# Patient Record
Sex: Female | Born: 1970 | Race: White | Hispanic: No | Marital: Married | Smoking: Never smoker
Health system: Southern US, Community
[De-identification: ages and names within clinical notes are randomized; demographics above are authoritative.]

## PROBLEM LIST (undated history)

## (undated) DIAGNOSIS — I10 Essential (primary) hypertension: Secondary | ICD-10-CM

## (undated) DIAGNOSIS — M0760 Enteropathic arthropathies, unspecified site: Secondary | ICD-10-CM

## (undated) DIAGNOSIS — R600 Localized edema: Secondary | ICD-10-CM

## (undated) DIAGNOSIS — B0229 Other postherpetic nervous system involvement: Secondary | ICD-10-CM

## (undated) DIAGNOSIS — G4700 Insomnia, unspecified: Secondary | ICD-10-CM

## (undated) DIAGNOSIS — Z8719 Personal history of other diseases of the digestive system: Secondary | ICD-10-CM

## (undated) DIAGNOSIS — N83209 Unspecified ovarian cyst, unspecified side: Secondary | ICD-10-CM

## (undated) DIAGNOSIS — M069 Rheumatoid arthritis, unspecified: Secondary | ICD-10-CM

## (undated) DIAGNOSIS — M1990 Unspecified osteoarthritis, unspecified site: Secondary | ICD-10-CM

## (undated) DIAGNOSIS — R609 Edema, unspecified: Secondary | ICD-10-CM

## (undated) HISTORY — DX: Personal history of other diseases of the digestive system: Z87.19

## (undated) HISTORY — DX: Localized edema: R60.0

## (undated) HISTORY — DX: Rheumatoid arthritis, unspecified: M06.9

## (undated) HISTORY — PX: LUMBAR FUSION: SHX111

## (undated) HISTORY — DX: Unspecified osteoarthritis, unspecified site: M19.90

## (undated) HISTORY — PX: CHOLECYSTECTOMY: SHX55

## (undated) HISTORY — DX: Insomnia, unspecified: G47.00

## (undated) HISTORY — DX: Unspecified ovarian cyst, unspecified side: N83.209

## (undated) HISTORY — DX: Enteropathic arthropathies, unspecified site: M07.60

## (undated) HISTORY — DX: Other postherpetic nervous system involvement: B02.29

## (undated) HISTORY — PX: OTHER SURGICAL HISTORY: SHX169

## (undated) HISTORY — DX: Essential (primary) hypertension: I10

## (undated) HISTORY — DX: Edema, unspecified: R60.9

---

## 2015-12-07 DIAGNOSIS — N838 Other noninflammatory disorders of ovary, fallopian tube and broad ligament: Secondary | ICD-10-CM | POA: Insufficient documentation

## 2015-12-17 DIAGNOSIS — R102 Pelvic and perineal pain: Secondary | ICD-10-CM | POA: Insufficient documentation

## 2015-12-17 DIAGNOSIS — M545 Low back pain, unspecified: Secondary | ICD-10-CM | POA: Insufficient documentation

## 2016-01-01 DIAGNOSIS — Z9889 Other specified postprocedural states: Secondary | ICD-10-CM | POA: Insufficient documentation

## 2016-01-01 DIAGNOSIS — Z09 Encounter for follow-up examination after completed treatment for conditions other than malignant neoplasm: Secondary | ICD-10-CM | POA: Insufficient documentation

## 2016-10-05 DIAGNOSIS — K5190 Ulcerative colitis, unspecified, without complications: Secondary | ICD-10-CM | POA: Insufficient documentation

## 2016-10-05 DIAGNOSIS — M0760 Enteropathic arthropathies, unspecified site: Secondary | ICD-10-CM | POA: Insufficient documentation

## 2016-10-05 HISTORY — DX: Ulcerative colitis, unspecified, without complications: K51.90

## 2018-05-17 DIAGNOSIS — R110 Nausea: Secondary | ICD-10-CM | POA: Insufficient documentation

## 2018-05-17 DIAGNOSIS — R1031 Right lower quadrant pain: Secondary | ICD-10-CM | POA: Insufficient documentation

## 2018-07-26 ENCOUNTER — Encounter: Payer: Self-pay | Admitting: Neurology

## 2018-08-31 ENCOUNTER — Encounter: Payer: Self-pay | Admitting: Neurology

## 2018-09-01 NOTE — Progress Notes (Signed)
[facility] Clinic Note - Initial Visit   Date: 09/05/2018   [name] MRN: [id number] DOB: 1970-06-23   Dear Dr. Penelope Galas:  Thank you for your kind referral of Michelle Newman for consultation of generalized pain and weakness. Although her history is well known to you, please allow [place] to reiterate it for the purpose of our medical record. The patient was accompanied to the clinic by self.  History of Present Illness: Michelle Newman is a 48 y.o. right-handed female with ulcerative colitis, rheumatoid arthritis (followed by Dr. Amil Amen), recurrent shingles, hypertension, and chronic pain presenting for evaluation of generalized weakness and myalgias.  Starting in 2019, she began having generalized pain and muscle spasms involving the arms, legs, and back.  She complains of frequent falls, having 5-6 falls in 2020 and usually always involves going up or down stairs. She has imbalance which is worse in the evening.  She is chronically tired and feels exhausted, especially after lunchtime.  She takes a nap a few times per week, which allows her to work for a few more hours, but then quickly gets exhausted again.  Some days, she has difficulty opening her eye lids, reporting that they are completely shut when she wakes up.  This can improve as the day goes on.  She also has problems with constipation and urinary retention.  She is frustrated that no one can help her.  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    Past Medical History:  Diagnosis Date  . Edema   . Enteropathic arthritis   . Hx of chronic ulcerative colitis   . Hypertension   . Insomnia   . Other postherpetic nervous system involvement   . Ovarian cyst   . Peripheral edema     Past Surgical History:  Procedure Laterality Date  . CHOLECYSTECTOMY    . ex lap for ruptured ovarian cyst    . hysterectomy with ovaries remaining       Medications:  Outpatient Encounter Medications  as of 09/05/2018  Medication Sig  . albuterol (VENTOLIN HFA) 108 (90 Base) MCG/ACT inhaler Inhale into the lungs every 6 (six) hours as needed for wheezing or shortness of breath.  Marland Kitchen atenolol-chlorthalidone (TENORETIC) 50-25 MG tablet Take 0.5 tablets by mouth daily. Takes 1/2 tab 50/78m  . folic acid (FOLVITE) 1 MG tablet Take 1 mg by mouth 2 (two) times daily.  .Marland Kitchengabapentin (NEURONTIN) 300 MG capsule Take 300 mg by mouth. Takes one tablet qhs  . sulfaSALAzine (AZULFIDINE) 500 MG tablet Take 500 mg by mouth 2 (two) times daily.  .Marland KitchensulfaSALAzine (AZULFIDINE) 500 MG tablet TAKE 2 TABLET TWICE DAILY  . torsemide (DEMADEX) 10 MG tablet Take 10 mg by mouth daily. 1-2 tablests prn for swelling  . atenolol (TENORMIN) 25 MG tablet Take by mouth.  . Golimumab (SIMPONI) 100 MG/ML SOSY Inject into the skin.  .Marland KitchenHYDROcodone-acetaminophen (NORCO) 7.5-325 MG tablet Take 1 tablet by mouth every 6 (six) hours as needed. for pain  . Multiple Vitamin (MULTI-VITAMIN) tablet   . traMADol (ULTRAM) 50 MG tablet Take by mouth.  . valACYclovir (VALTREX) 500 MG tablet Take by mouth.   No facility-administered encounter medications on file as of 09/05/2018.     Allergies:  Allergies  Allergen Reactions  . Flexeril [Cyclobenzaprine]     Unable to urinate   . Metoprolol Succinate [Metoprolol Tartrate]     Flushing     Family History: Family History  Problem Relation Age  of Onset  . Non-Hodgkin's lymphoma Mother        htn  . Hypertension Mother   . CAD Father     Social History: Social History   Tobacco Use  . Smoking status: Never Smoker  . Smokeless tobacco: Never Used  Substance Use Topics  . Alcohol use: Never    Frequency: Never  . Drug use: Never   Social History   Social History Narrative   Married 2 sons and one girl   One story   Right handed     Review of Systems:  CONSTITUTIONAL: No fevers, chills, night sweats, or weight loss.   EYES: No visual changes or eye pain ENT: No  hearing changes.  No history of nose bleeds.   RESPIRATORY: No cough, wheezing and shortness of breath.   CARDIOVASCULAR: Negative for chest pain, and palpitations.   GI: Negative for abdominal discomfort, blood in stools or black stools.  No recent change in bowel habits.   GU:  +history of incontinence.   MUSCLOSKELETAL: +history of joint pain or swelling.  +myalgias.   SKIN: Negative for lesions, rash, and itching.   HEMATOLOGY/ONCOLOGY: Negative for prolonged bleeding, bruising easily, and swollen nodes.  No history of cancer.   ENDOCRINE: Negative for cold or heat intolerance, polydipsia or goiter.   PSYCH:  +depression +anxiety symptoms.   NEURO: As Above.   Vital Signs:  BP (!) 134/91   Pulse 72   Temp 98.9 F (37.2 C)   Ht '5\' 2"'  (1.575 m)   Wt 156 lb 12.8 oz (71.1 kg)   SpO2 97%   BMI 28.68 kg/m    General Medical Exam:   General:  Well appearing, comfortable.   Eyes/ENT: see cranial nerve examination.   Neck:   No carotid bruits. Respiratory:  Clear to auscultation, good air entry bilaterally.   Cardiac:  Regular rate and rhythm, no murmur.   Extremities:  No deformities, edema, or skin discoloration.  Skin:  No rashes or lesions.  Neurological Exam: MENTAL STATUS including orientation to time, place, person, recent and remote memory, attention span and concentration, language, and fund of knowledge is normal.  Speech is not dysarthric.  CRANIAL NERVES: II:  No visual field defects.  .   III-IV-VI: Pupils equal round and reactive to light.  Normal conjugate, extra-ocular eye movements in all directions of gaze.  No nystagmus.  No ptosis.   V:  Normal facial sensation.    VII:  Normal facial symmetry and movements.   VIII:  Normal hearing and vestibular function.   IX-X:  Normal palatal movement.   XI:  Normal shoulder shrug and head rotation.   XII:  Normal tongue strength and range of motion, no deviation or fasciculation.  MOTOR:  No atrophy, fasciculations or  abnormal movements.  No pronator drift.   Upper Extremity:  Right  Left  Deltoid  5/5   5/5   Biceps  5/5   5/5   Triceps  5/5   5/5   Infraspinatus 5/5  5/5  Medial pectoralis 5/5  5/5  Wrist extensors  5/5   5/5   Wrist flexors  5/5   5/5   Finger extensors  5/5   5/5   Finger flexors  5/5   5/5   Dorsal interossei  5/5   5/5   Abductor pollicis  5/5   5/5   Tone (Ashworth scale)  0  0   Lower Extremity:  Right  Left  Hip  flexors  5/5   5/5   Hip extensors  5/5   5/5   Adductor 5/5  5/5  Abductor 5/5  5/5  Knee flexors  5/5   5/5   Knee extensors  5/5   5/5   Dorsiflexors  5/5   5/5   Plantarflexors  5/5   5/5   Toe extensors  5/5   5/5   Toe flexors  5/5   5/5   Tone (Ashworth scale)  0  0   MSRs:  Right        Left                  brachioradialis 2+  2+  biceps 2+  2+  triceps 2+  2+  patellar 3+  3+  ankle jerk 2+  2+  Hoffman no  no  plantar response down  down   SENSORY:  Normal and symmetric perception of light touch, pinprick, vibration, and proprioception.  Romberg's sign absent.   COORDINATION/GAIT: Normal finger-to- nose-finger and heel-to-shin.  Intact rapid alternating movements bilaterally.  Able to rise from a chair without using arms.  Gait narrow based and stable. Tandem and stressed gait intact.    IMPRESSION: Mrs. Corbit is a 48 year-old female presenting with a myriad of nonspecific symptoms including generalized pain, weakness and fatigue.  Exam shows brisk reflexes throughout, otherwise normal motor strength, intact muscle bulk, and normal sensation.  Her symptomology is not consistent with primary neuromuscular disorder as symptoms are too widespread and exam is remarkably intact.  She had many questions on potential diagnosis and I reassured her that she does not have CIDP as I would expect reflexes to be absent or reduced and there would be more objective findings on exam such as motor or sensory changes.  Myasthenia gravis also is unlikely with  her widespread pain, since this is painless neuromuscular junction disorder.  To be complete, I will screen for myasthenia, myopathy, and B12 deficiency.  She will also undergo MRI brain and cervical spine to check for demyelinating disease, as she does have other autoimmune disease.  If this is normal, NCS/EMG will be the next step.   Greater than 50% of this 45 minute visit was spent in counseling, explanation of diagnosis, planning of further management, and coordination of care.   Thank you for allowing me to participate in patient's care.  If I can answer any additional questions, I would be pleased to do so.    Sincerely,    Oliviagrace Crisanti K. Posey Pronto, DO

## 2018-09-05 ENCOUNTER — Other Ambulatory Visit: Payer: Self-pay

## 2018-09-05 ENCOUNTER — Encounter: Payer: Self-pay | Admitting: Neurology

## 2018-09-05 ENCOUNTER — Other Ambulatory Visit (INDEPENDENT_AMBULATORY_CARE_PROVIDER_SITE_OTHER): Payer: 59

## 2018-09-05 ENCOUNTER — Ambulatory Visit (INDEPENDENT_AMBULATORY_CARE_PROVIDER_SITE_OTHER): Payer: 59 | Admitting: Neurology

## 2018-09-05 VITALS — BP 134/91 | HR 72 | Temp 98.9°F | Ht 62.0 in | Wt 156.8 lb

## 2018-09-05 DIAGNOSIS — G8929 Other chronic pain: Secondary | ICD-10-CM | POA: Diagnosis not present

## 2018-09-05 DIAGNOSIS — R292 Abnormal reflex: Secondary | ICD-10-CM | POA: Diagnosis not present

## 2018-09-05 DIAGNOSIS — R52 Pain, unspecified: Secondary | ICD-10-CM | POA: Diagnosis not present

## 2018-09-05 DIAGNOSIS — R531 Weakness: Secondary | ICD-10-CM | POA: Diagnosis not present

## 2018-09-05 LAB — VITAMIN B12: Vitamin B-12: 377 pg/mL (ref 211–911)

## 2018-09-05 LAB — CK: Total CK: 46 U/L (ref 7–177)

## 2018-09-05 NOTE — Patient Instructions (Addendum)
Check labs  MRI brain and cervical spine  We will call you with the results and let you know the next step.

## 2018-09-09 ENCOUNTER — Telehealth: Payer: Self-pay | Admitting: Neurology

## 2018-09-09 NOTE — Telephone Encounter (Signed)
Called spoke with patient she is aware and understands

## 2018-09-09 NOTE — Telephone Encounter (Signed)
Results back  Please review then advise

## 2018-09-09 NOTE — Telephone Encounter (Signed)
CK and vitamin B12 is normal.  Myasthenia panel take up to 3 weeks to come back.  We will notify of these results, when available.

## 2018-09-09 NOTE — Telephone Encounter (Signed)
Patient is wanting her recent results from blood work. Thanks!

## 2018-09-15 LAB — MYASTHENIA GRAVIS PANEL 2
A CHR BINDING ABS: <0.3 nmol/L
ACHR Blocking Abs: <15 % Inhibition (ref <15)
Acetylchol Modul Ab: 14 % Inhibition

## 2018-09-19 ENCOUNTER — Telehealth: Payer: Self-pay

## 2018-09-19 NOTE — Telephone Encounter (Signed)
Patient notified of labs via voicemail

## 2018-09-19 NOTE — Telephone Encounter (Signed)
-----   Message from Alda Berthold, DO sent at 09/16/2018  4:51 PM EDT ----- Please notify patient lab are within normal limits, including antibodies for myasthenia.  Thank you.

## 2018-09-21 ENCOUNTER — Telehealth: Payer: Self-pay | Admitting: Neurology

## 2018-09-21 NOTE — Telephone Encounter (Signed)
Patient is calling in about lab work results. Please call her back. Thanks!

## 2018-09-21 NOTE — Telephone Encounter (Signed)
No answer at 150 

## 2018-09-23 ENCOUNTER — Telehealth: Payer: Self-pay

## 2018-09-23 NOTE — Telephone Encounter (Signed)
Patient is returning your call.    Thanks

## 2018-09-23 NOTE — Telephone Encounter (Signed)
Patient called in regards to her labs, notified her of results

## 2018-09-27 ENCOUNTER — Other Ambulatory Visit: Payer: Self-pay | Admitting: Internal Medicine

## 2018-09-27 DIAGNOSIS — N6320 Unspecified lump in the left breast, unspecified quadrant: Secondary | ICD-10-CM

## 2018-10-05 ENCOUNTER — Ambulatory Visit
Admission: RE | Admit: 2018-10-05 | Discharge: 2018-10-05 | Disposition: A | Discharge status: Home / Self Care | Payer: 59 | Source: Ambulatory Visit | Attending: Neurology | Admitting: Neurology

## 2018-10-05 DIAGNOSIS — R292 Abnormal reflex: Secondary | ICD-10-CM

## 2018-10-05 DIAGNOSIS — R531 Weakness: Secondary | ICD-10-CM

## 2018-10-05 MED ORDER — GADOBENATE DIMEGLUMINE 529 MG/ML IV SOLN
14.0000 mL | Freq: Once | INTRAVENOUS | Status: AC | PRN
  Administered 2018-10-05: 14:00:00 14 mL via INTRAVENOUS

## 2018-10-06 ENCOUNTER — Other Ambulatory Visit: Payer: Self-pay

## 2018-10-06 ENCOUNTER — Ambulatory Visit
Admission: RE | Admit: 2018-10-06 | Discharge: 2018-10-06 | Disposition: A | Discharge status: Home / Self Care | Payer: 59 | Source: Ambulatory Visit | Attending: Internal Medicine | Admitting: Internal Medicine

## 2018-10-06 DIAGNOSIS — N6320 Unspecified lump in the left breast, unspecified quadrant: Secondary | ICD-10-CM

## 2018-10-07 ENCOUNTER — Other Ambulatory Visit: Payer: Self-pay

## 2018-10-07 ENCOUNTER — Telehealth: Payer: Self-pay

## 2018-10-07 ENCOUNTER — Telehealth: Payer: Self-pay | Admitting: Neurology

## 2018-10-07 DIAGNOSIS — R52 Pain, unspecified: Secondary | ICD-10-CM

## 2018-10-07 DIAGNOSIS — G8929 Other chronic pain: Secondary | ICD-10-CM

## 2018-10-07 DIAGNOSIS — R531 Weakness: Secondary | ICD-10-CM

## 2018-10-07 NOTE — Telephone Encounter (Signed)
-----   Message from Alda Berthold, DO sent at 10/07/2018 12:58 PM EDT ----- Please inform patient that her MRI brain is normal  - no sign of stroke, multiple sclerosis, or tumor.  Her MRI cervical spine shows disc bulges at a few levels, which is slightly worse when compared to 2016.  It would not explain her widespread pain and weakness, so I recommend NCS/EMG of the arm and leg (worse side) as the next step.

## 2018-10-07 NOTE — Telephone Encounter (Signed)
Patient called to see if her MRI results are ready for her yet from [facility] 10/05/2018.

## 2018-10-07 NOTE — Telephone Encounter (Signed)
Patient advised of results and dana to schedule emg

## 2018-10-07 NOTE — Telephone Encounter (Signed)
Patient informed, see result note

## 2018-11-24 ENCOUNTER — Other Ambulatory Visit: Payer: Self-pay

## 2018-11-24 ENCOUNTER — Ambulatory Visit (INDEPENDENT_AMBULATORY_CARE_PROVIDER_SITE_OTHER): Payer: 59 | Admitting: Neurology

## 2018-11-24 DIAGNOSIS — R52 Pain, unspecified: Secondary | ICD-10-CM

## 2018-11-24 DIAGNOSIS — R531 Weakness: Secondary | ICD-10-CM

## 2018-11-24 DIAGNOSIS — G8929 Other chronic pain: Secondary | ICD-10-CM

## 2018-11-24 NOTE — Procedures (Signed)
[facility]  [address] [place], [address]  [place], [place] [address] Tel: [phone] Fax:  [phone] Test Date:  11/24/2018  Patient: Michelle Newman DOB: Dec 09, 1970 Physician: Nita Sickle, DO  Sex: Female Height: 5\' 2"  Ref Phys: , DO  ID#: Nita Sickle Temp: 32.0C Technician:    Patient Complaints: This is a 48 year old female referred for evaluation of generalized malaise and weakness.  NCV & EMG Findings: Extensive electrodiagnostic testing of the right upper and lower extremities along with repetitive nerve stimulation shows: 1. All sensory responses are within normal limits including the right median, ulnar, mixed palmar, sural, and superficial peroneal nerves. 2. All motor responses are within normal limits including right median, ulnar, peroneal, and tibial nerves. 3. Repetitive nerve stimulation of the median and peroneal nerves recording at the abductor pollicis brevis and tibialis anterior, respectively, is within normal limits.  Specifically, there is no abnormal decrement. 4. Right tibial H reflex study is within normal limits. 5. There is no evidence of active or chronic motor axonal loss changes affecting any of the tested muscles.  Motor unit configuration and recruitment pattern is within normal limits.  Impression: This is a normal study.  In particular, there is no evidence of a generalized myopathy, neuromuscular junction disorder, sensorimotor neuropathy, or cervical/lumbosacral radiculopathy.   ___________________________ 51, DO    Nerve Conduction Studies Anti Sensory Summary Table   Site NR Peak (ms) Norm Peak (ms) P-T Amp (V) Norm P-T Amp  Right Median Anti Sensory (2nd Digit)  32C  Wrist    2.5 <3.4 48.8 >20  Right Sup Peroneal Anti Sensory (Ant Lat Mall)  32C  12 cm    1.7 <4.5 26.8 >5  Right Sural Anti Sensory (Lat Mall)  32C  Calf    2.4 <4.5 25.9 >5  Right Ulnar Anti Sensory (5th Digit)  32C  Wrist     2.3 <3.1 53.7 >12   Motor Summary Table   Site NR Onset (ms) Norm Onset (ms) O-P Amp (mV) Norm O-P Amp Site1 Site2 Delta-0 (ms) Dist (cm) Vel (m/s) Norm Vel (m/s)  Right Median Motor (Abd Poll Brev)  32C  Wrist    2.3 <3.9 10.3 >6 Elbow Wrist 4.2 26.0 62 >50  Elbow    6.5  10.1         Right Peroneal Motor (Ext Dig Brev)  32C  Ankle    2.7 <5.5 4.3 >3 B Fib Ankle 6.1 35.0 57 >40  B Fib    8.8  4.0  Poplt B Fib 1.4 8.0 57 >40  Poplt    10.2  4.0         Right Tibial Motor (Abd Hall Brev)  32C  Ankle    2.5 <6.0 14.4 >8 Knee Ankle 7.1 36.0 51 >40  Knee    9.6  11.3         Right Ulnar Motor (Abd Dig Minimi)  32C  Wrist    1.8 <3.1 10.5 >7 B Elbow Wrist 3.2 21.0 66 >50  B Elbow    5.0  10.3  A Elbow B Elbow 1.6 10.0 63 >50  A Elbow    6.6  10.1          Comparison Summary Table   Site NR Peak (ms) Norm Peak (ms) P-T Amp (V) Site1 Site2 Delta-P (ms) Norm Delta (ms)  Right Median/Ulnar Palm Comparison (Wrist - 8cm)  32C  Median Palm    1.6 <2.2 78.7 Median  Palm Ulnar Palm 0.1   Ulnar Palm    1.5 <2.2 20.0       H Reflex Studies   NR H-Lat (ms) Lat Norm (ms) L-R H-Lat (ms)  Right Tibial (Gastroc)  32C     29.12 <35    EMG   Side Muscle Ins Act Fibs Psw Fasc Number Recrt Dur Dur. Amp Amp. Poly Poly. Comment  Right 1stDorInt Nml Nml Nml Nml Nml Nml Nml Nml Nml Nml Nml Nml N/A  Right AntTibialis Nml Nml Nml Nml Nml Nml Nml Nml Nml Nml Nml Nml N/A  Right Gastroc Nml Nml Nml Nml Nml Nml Nml Nml Nml Nml Nml Nml N/A  Right Flex Dig Long Nml Nml Nml Nml Nml Nml Nml Nml Nml Nml Nml Nml N/A  Right RectFemoris Nml Nml Nml Nml Nml Nml Nml Nml Nml Nml Nml Nml N/A  Right GluteusMed Nml Nml Nml Nml Nml Nml Nml Nml Nml Nml Nml Nml N/A  Right PronatorTeres Nml Nml Nml Nml Nml Nml Nml Nml Nml Nml Nml Nml N/A  Right Biceps Nml Nml Nml Nml Nml Nml Nml Nml Nml Nml Nml Nml N/A  Right Triceps Nml Nml Nml Nml Nml Nml Nml Nml Nml Nml Nml Nml N/A  Right Deltoid Nml Nml Nml Nml Nml Nml Nml Nml  Nml Nml Nml Nml N/A   RNS   Trial # Label Amp 1 (mV)  O-P Amp 5 (mV)  O-P Amp % Dif Area 1 (mVms) Area 5 (mVms) Area % Dif Rep Rate Train Length Pause Time (min:sec) Comments  Right AntTibialis  Tr 1 Baseline 5.03 5.04 0.2 30.54 29.64 -2.9 3.00 10 00:30   Tr 2 Post Exercise 5.02 4.80 -4.3 30.37 28.19 -7.2 3.00 10 01:00   Tr 3 1 Min Post 4.94 4.69 -4.9 30.55 28.17 -7.8 3.00 10 01:00   Tr 4 2 Min Post 4.89 4.78 -2.2 30.22 28.73 -4.9 3.00 10 01:00   Tr 5 3 Min Post 4.93 4.82 -2.2 30.61 29.25 -4.5 3.00 10 00:00   Right Abd Poll Brev  Tr 1 Baseline 11.52 11.54 0.2 32.24 30.73 -4.7 3.00 10 00:30   Tr 2 Post Exercise 11.81 11.73 -0.6 30.00 29.30 -2.3 3.00 10 01:00   Tr 3 1 Min Post 11.82 11.76 -0.5 31.69 30.06 -5.1 3.00 10 01:00   Tr 4 2 Min Post 11.79 11.73 -0.5 31.62 30.10 -4.8 3.00 10 01:00   Tr 5 3 Min Post 11.78 11.72 -0.5 31.80 30.11 -5.3 3.00 10 00:00         Waveforms:

## 2018-11-25 ENCOUNTER — Telehealth: Payer: Self-pay

## 2018-11-25 NOTE — Telephone Encounter (Signed)
Patient advised of emg results and notifed.

## 2019-01-23 DIAGNOSIS — M4722 Other spondylosis with radiculopathy, cervical region: Secondary | ICD-10-CM | POA: Insufficient documentation

## 2019-03-13 ENCOUNTER — Ambulatory Visit: Payer: Self-pay | Admitting: Podiatry

## 2019-03-14 ENCOUNTER — Other Ambulatory Visit: Payer: Self-pay | Admitting: Sports Medicine

## 2019-03-14 DIAGNOSIS — M79671 Pain in right foot: Secondary | ICD-10-CM

## 2019-03-15 ENCOUNTER — Telehealth: Payer: Self-pay | Admitting: *Deleted

## 2019-03-15 ENCOUNTER — Ambulatory Visit (INDEPENDENT_AMBULATORY_CARE_PROVIDER_SITE_OTHER): Payer: 59

## 2019-03-15 ENCOUNTER — Other Ambulatory Visit: Payer: Self-pay

## 2019-03-15 ENCOUNTER — Encounter: Payer: Self-pay | Admitting: Sports Medicine

## 2019-03-15 ENCOUNTER — Ambulatory Visit (INDEPENDENT_AMBULATORY_CARE_PROVIDER_SITE_OTHER): Payer: 59 | Admitting: Sports Medicine

## 2019-03-15 DIAGNOSIS — M792 Neuralgia and neuritis, unspecified: Secondary | ICD-10-CM | POA: Diagnosis not present

## 2019-03-15 DIAGNOSIS — M79671 Pain in right foot: Secondary | ICD-10-CM | POA: Diagnosis not present

## 2019-03-15 DIAGNOSIS — M84374A Stress fracture, right foot, initial encounter for fracture: Secondary | ICD-10-CM | POA: Diagnosis not present

## 2019-03-15 DIAGNOSIS — M779 Enthesopathy, unspecified: Secondary | ICD-10-CM | POA: Diagnosis not present

## 2019-03-15 MED ORDER — METHYLPREDNISOLONE 4 MG PO TBPK: ORAL_TABLET | ORAL | 0 refills | Status: DC

## 2019-03-15 NOTE — Telephone Encounter (Signed)
MRI order was put in today and is going to be at [facility]. Michelle Newman

## 2019-03-15 NOTE — Telephone Encounter (Signed)
Dr Marylene Land, the MRI order is in and it is at [facility]. Misty Stanley

## 2019-03-15 NOTE — Addendum Note (Signed)
Addended by: Hadley Pen R on: 03/15/2019 04:10 PM   Modules accepted: Orders

## 2019-03-15 NOTE — Telephone Encounter (Signed)
-----   Message from [place], [place] sent at 03/15/2019 12:41 PM EST ----- Regarding: MRI R foot MRI w/o contrast Chronic foot pain  R/o non healing fracuture/stress fracture 3rd metatarsal Right foot  Xrays nondiagnostic

## 2019-03-15 NOTE — Progress Notes (Signed)
Subjective: Michelle Newman is a 49 y.o. female patient who presents to office for evaluation of right foot pain. Patient complains of progressive pain especially over the last 8 months in the right foot that is worse underneath the ball at the third and fourth toe joints reports that over the last few months the pain has been worse causing her to walk differently and she has noticed that she has a callus now to the plantar aspect of her fifth metatarsal head reports that by the end of the day the third fourth and fifth toes hurt so much that it causes her to walk on the lateral side of the foot reports that she has a history of rheumatoid arthritis and the pain was not as bad when she was on her rheumatological medications however she had to stop taking those medications in order to have neck surgery and reports significant dull achy that worsens with some tingling to the toes pain is sharp 8 out of 10 by the end of the day and reports that her foot hurts so bad and she still feels the pressure like she is sleeping with a shoe on at the end of her day.  Denies injury/trip/fall/sprain/any causative factors.   Patient had x-rays 2 weeks ago performed by PCP.  Review of Systems  All other systems reviewed and are negative.   Patient Active Problem List   Diagnosis Date Noted  . Cervical spondylosis with radiculopathy 01/23/2019  . Right lower quadrant pain 05/17/2018  . Nausea 05/17/2018  . 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 Ulcerative colitis (HCC) 10/05/2016  . Postoperative examination 01/01/2016  . S/P exploratory laparotomy 01/01/2016  . Acute left-sided low back pain without sciatica 12/17/2015  . Pelvic pressure in female 12/17/2015  . Ovarian mass, left 12/07/2015    Current Outpatient Medications on File Prior to Visit  Medication Sig Dispense Refill  . albuterol (VENTOLIN HFA) 108 (90 Base) MCG/ACT inhaler Inhale into the lungs every 6 (six) hours as needed for wheezing or  shortness of breath.    Marland Kitchen atenolol-chlorthalidone (TENORETIC) 50-25 MG tablet Take 0.5 tablets by mouth daily. Takes 1/2 tab 50/25mg     . folic acid (FOLVITE) 1 MG tablet Take 1 mg by mouth 2 (two) times daily.    Marland Kitchen gabapentin (NEURONTIN) 300 MG capsule Take 300 mg by mouth. Takes one tablet qhs    . Golimumab (SIMPONI) 100 MG/ML SOSY Inject into the skin.    Marland Kitchen HYDROcodone-acetaminophen (NORCO) 7.5-325 MG tablet Take 1 tablet by mouth every 6 (six) hours as needed. for pain    . sulfaSALAzine (AZULFIDINE) 500 MG tablet TAKE 2 TABLET TWICE DAILY    . torsemide (DEMADEX) 10 MG tablet Take 10 mg by mouth daily. 1-2 tablests prn for swelling    . traMADol (ULTRAM) 50 MG tablet Take by mouth.     No current facility-administered medications on file prior to visit.    Allergies  Allergen Reactions  . Amoxicillin-Pot Clavulanate Nausea And Vomiting    GI bleeding noted within 24 hours of taking  . Flexeril [Cyclobenzaprine]     Unable to urinate   . Metoprolol Succinate [Metoprolol Tartrate]     Flushing     Objective:  General: Alert and oriented x3 in no acute distress  Dermatology: No open lesions bilateral lower extremities, no webspace macerations, no ecchymosis bilateral, all nails x 10 are well manicured.  Mild reactive callus first toe and plantar fifth metatarsophalangeal joint on right.  Vascular: Dorsalis  Pedis and Posterior Tibial pedal pulses palpable, Capillary Fill Time 3 seconds,(+) pedal hair growth bilateral, no edema bilateral lower extremities, Temperature gradient within normal limits.  Neurology: Gross sensation intact via light touch bilateral, subjective tingling to toes on right foot.  Musculoskeletal: Moderate tenderness to palpation along the third metatarsal shaft greater than the fourth and fifth on the right foot, there is pain with compression of the lesser metatarsals however no Mulder's click or no signs of acute neuroma. Strength within normal  limits.  Gait: Antalgic gait  Xrays  Right foot   Impression: There is questionable changes noted to the third metatarsal shaft suspicious of possible stress fracture, no other acute findings.  Assessment and Plan: Problem List Items Addressed This Visit    None    Visit Diagnoses    Stress fracture, right foot, initial encounter for fracture    -  Primary   Neuritis       Capsulitis       Right foot pain           -Complete examination performed -Xrays reviewed -Discussed treatment options for left foot pain possible stress fracture -Rx MRI for further evaluation and the extent of chronic pain to rule out fracture or neuroma/neuritis or severe capsulitis -Advised patient if her foot still hurts her may start steroid Dosepak to help with her symptoms as well -Advised patient to use cam boot of which she plans to borrow from a friend to help offload the forefoot until her MRI can be completed -Recommend rest ice elevation and to avoid strenuous activities that could exacerbate the pain in the ball of her foot -Patient to return to office after MRI or sooner if condition worsens.  Landis Martins, DPM

## 2019-03-15 NOTE — Telephone Encounter (Signed)
Ok thanks Make sure the patient has the information to call [facility] to schedule an appt for her MRI

## 2019-03-17 NOTE — Telephone Encounter (Signed)
I called the patient to let her know to come to [facility] [address] [place] and the patient was fine with coming to [place] and I gave her the phone number as well. Misty Stanley

## 2019-03-23 ENCOUNTER — Telehealth: Payer: Self-pay

## 2019-03-23 NOTE — Telephone Encounter (Signed)
MR foot w/o contrast approved Authorization number: [id number] J Exp: Feb 25-April 11-21

## 2019-03-23 NOTE — Telephone Encounter (Signed)
Peer to peer is require for MRI of Right foot.  Reference number: [id number]

## 2019-03-27 ENCOUNTER — Other Ambulatory Visit: Payer: Self-pay | Admitting: Sports Medicine

## 2019-03-27 DIAGNOSIS — M84374A Stress fracture, right foot, initial encounter for fracture: Secondary | ICD-10-CM

## 2019-03-29 ENCOUNTER — Other Ambulatory Visit: Payer: Self-pay

## 2019-03-29 ENCOUNTER — Ambulatory Visit
Admission: RE | Admit: 2019-03-29 | Discharge: 2019-03-29 | Disposition: A | Discharge status: Home / Self Care | Payer: 59 | Source: Ambulatory Visit | Attending: Sports Medicine | Admitting: Sports Medicine

## 2019-03-29 DIAGNOSIS — M79671 Pain in right foot: Secondary | ICD-10-CM

## 2019-03-29 DIAGNOSIS — M84374A Stress fracture, right foot, initial encounter for fracture: Secondary | ICD-10-CM

## 2019-04-07 ENCOUNTER — Other Ambulatory Visit: Payer: Self-pay

## 2019-04-07 ENCOUNTER — Ambulatory Visit (INDEPENDENT_AMBULATORY_CARE_PROVIDER_SITE_OTHER): Payer: 59 | Admitting: Sports Medicine

## 2019-04-07 ENCOUNTER — Ambulatory Visit: Payer: 59 | Admitting: Sports Medicine

## 2019-04-07 ENCOUNTER — Encounter: Payer: Self-pay | Admitting: Sports Medicine

## 2019-04-07 DIAGNOSIS — M7752 Other enthesopathy of left foot: Secondary | ICD-10-CM

## 2019-04-07 DIAGNOSIS — M069 Rheumatoid arthritis, unspecified: Secondary | ICD-10-CM

## 2019-04-07 DIAGNOSIS — M7751 Other enthesopathy of right foot: Secondary | ICD-10-CM

## 2019-04-07 DIAGNOSIS — M779 Enthesopathy, unspecified: Secondary | ICD-10-CM | POA: Diagnosis not present

## 2019-04-07 DIAGNOSIS — M79672 Pain in left foot: Secondary | ICD-10-CM

## 2019-04-07 DIAGNOSIS — M79671 Pain in right foot: Secondary | ICD-10-CM

## 2019-04-07 MED ORDER — TRIAMCINOLONE ACETONIDE 10 MG/ML IJ SUSP
10.0000 mg | Freq: Once | INTRAMUSCULAR | Status: AC
  Administered 2019-04-07: 10 mg

## 2019-04-07 NOTE — Progress Notes (Signed)
Subjective: Michelle Newman is a 49 y.o. female patient who presents to office for evaluation of right greater than left foot pain. Patient is also here for discussion of MRI results.  Patient reports that the pain is a lot better on the right when she is walking with the boot but when she takes it off it still hurts states that she is starting to feel a similar pain on her left foot and is wondering if this is from her rheumatoid arthritis reports that she has had 2 doses of her antirheumatologic medication and since taking this medication she has seen a difference with her joints as well.  Patient has completed steroid Dosepak and has been resting icing and elevating as much as possible.  Patient denies any new injury or problem and reports that the pain is to the balls of the foot and in between the toe joints right greater than left.  Patient Active Problem List   Diagnosis Date Noted  . Cervical spondylosis with radiculopathy 01/23/2019  . Right lower quadrant pain 05/17/2018  . Nausea 05/17/2018  . 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 Ulcerative colitis ([place]) 10/05/2016  . Postoperative examination 01/01/2016  . S/P exploratory laparotomy 01/01/2016  . Acute left-sided low back pain without sciatica 12/17/2015  . Pelvic pressure in female 12/17/2015  . Ovarian mass, left 12/07/2015    Current Outpatient Medications on File Prior to Visit  Medication Sig Dispense Refill  . albuterol (VENTOLIN HFA) 108 (90 Base) MCG/ACT inhaler Inhale into the lungs every 6 (six) hours as needed for wheezing or shortness of breath.    Marland Kitchen atenolol-chlorthalidone (TENORETIC) 50-25 MG tablet Take 0.5 tablets by mouth daily. Takes 1/2 tab 50/25mg     . folic acid (FOLVITE) 1 MG tablet Take 1 mg by mouth 2 (two) times daily.    02/$[id number]$$[id number]$ gabapentin (NEURONTIN) 300 MG capsule Take 300 mg by mouth. Takes one tablet qhs    . Golimumab (SIMPONI) 100 MG/ML SOSY Inject into the skin.    Marland Kitchen HYDROcodone-acetaminophen  (NORCO) 7.5-325 MG tablet Take 1 tablet by mouth every 6 (six) hours as needed. for pain    . methocarbamol (ROBAXIN) 500 MG tablet Take 500 mg by mouth 3 (three) times daily.    . methylPREDNISolone (MEDROL DOSEPAK) 4 MG TBPK tablet Take as directed 21 tablet 0  . sulfaSALAzine (AZULFIDINE) 500 MG tablet TAKE 2 TABLET TWICE DAILY    . torsemide (DEMADEX) 10 MG tablet Take 10 mg by mouth daily. 1-2 tablests prn for swelling    . traMADol (ULTRAM) 50 MG tablet Take by mouth.     No current facility-administered medications on file prior to visit.    Allergies  Allergen Reactions  . Amoxicillin-Pot Clavulanate Nausea And Vomiting    GI bleeding noted within 24 hours of taking  . Flexeril [Cyclobenzaprine]     Unable to urinate   . Metoprolol Succinate [Metoprolol Tartrate]     Flushing     Objective:  General: Alert and oriented x3 in no acute distress  Dermatology: No open lesions bilateral lower extremities, no webspace macerations, no ecchymosis bilateral, all nails x 10 are well manicured.  Vascular: Dorsalis Pedis and Posterior Tibial pedal pulses palpable, Capillary Fill Time 3 seconds,(+) pedal hair growth bilateral, no edema bilateral lower extremities, Temperature gradient within normal limits.  Neurology: 01-12-1989 sensation intact via light touch bilateral.  Musculoskeletal: Mild tenderness to palpation at the ball of the right greater than left foot especially in between the third  and fourth metatarsal heads as well as the second and third metatarsal heads with the third and fourth metatarsal heads being most symptomatic on the right.  MRI right foot, supportive of bursitis right third and fourth metatarsal heads and possible bursitis versus early ganglion cyst at the lateral aspect of the base of the second toe at the level of the metatarsophalangeal joint.  Gait: Cam boot assisted gait  Assessment and Plan: Problem List Items Addressed This Visit    None    Visit  Diagnoses    Capsulitis    -  Primary   Relevant Medications   triamcinolone acetonide (KENALOG) 10 MG/ML injection 10 mg (Start on 04/07/2019  7:45 PM)   Foot pain, bilateral       Bursitis of both feet       Rheumatoid arthritis of foot, unspecified laterality, unspecified whether rheumatoid factor present (HCC)       Relevant Medications   triamcinolone acetonide (KENALOG) 10 MG/ML injection 10 mg (Start on 04/07/2019  7:45 PM)     -Complete examination performed -MRI results reviewed -Discussed treatment options for capsulitis/bursitis secondary to rheumatoid arthritis and likely a flare after being taking off her rheumatoid medication for her neck surgery -After oral consent and aseptic prep, injected a mixture containing 1 ml of 2%  plain lidocaine, 1 ml 0.5% plain marcaine, 0.5 ml of kenalog 10 and 0.5 ml of dexamethasone phosphate into second and third web spaces in between the metatarsal heads on the right and left foot without complication. Post-injection care discussed with patient.  -Dispensed metatarsal pads to use in shoes as instructed and may transition to the shoes after the weekend if she is doing good and may discontinue the boot -Recommend good supportive shoes for foot type -Patient to return to office as needed or if symptoms are not better after 2 weeks or sooner if condition worsens.  Asencion Islam, DPM

## 2019-09-06 DIAGNOSIS — M4726 Other spondylosis with radiculopathy, lumbar region: Secondary | ICD-10-CM | POA: Insufficient documentation

## 2020-01-25 DIAGNOSIS — R0602 Shortness of breath: Secondary | ICD-10-CM | POA: Diagnosis not present

## 2020-03-15 IMAGING — MR MR FOOT*R* W/O CM
5 series · 40 of 40 positions shown · non-contrast
Comparison: Radiographs from 03/27/2019

CLINICAL DATA: Chronic right foot pain along the bases of the third
and fourth toes.

EXAM:
MRI OF THE RIGHT FOREFOOT WITHOUT CONTRAST
TECHNIQUE: Multiplanar, multisequence MR imaging of the right forefoot was
performed. No intravenous contrast was administered.

[Series 4: T1 · coronal · right · 3.0mm · 0.31mm/px · 10 of 45 slices shown (1 of 2)]
[im 1/45]
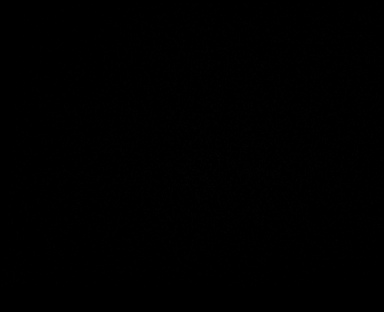
[im 5/45]
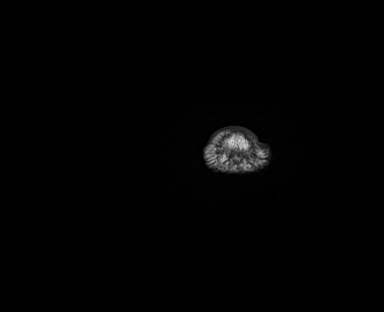
[im 10/45]
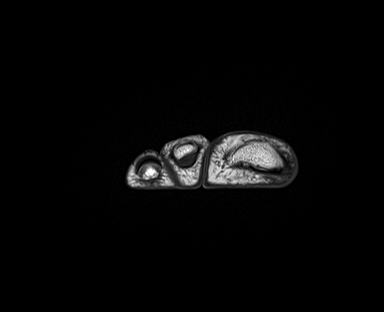
[im 15/45]
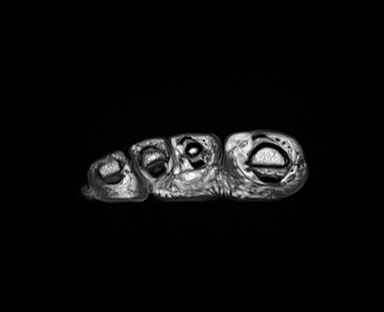
[im 20/45]
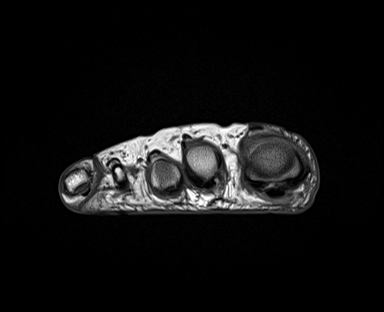
[im 25/45]
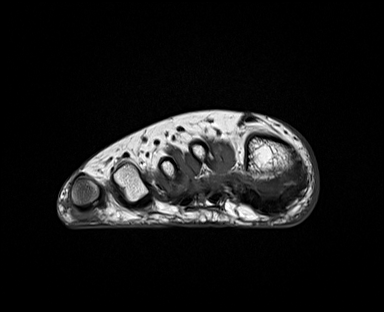
[im 30/45]
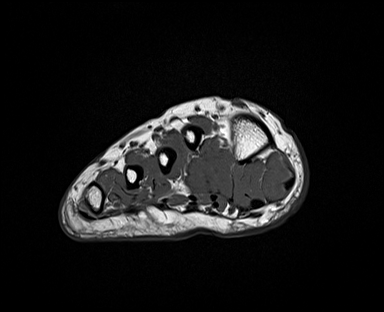
[im 35/45]
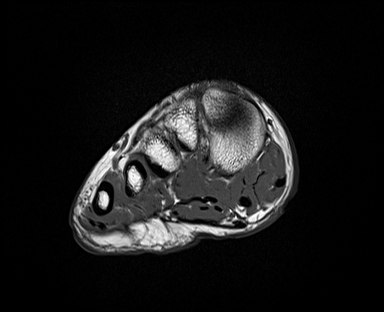
[im 40/45]
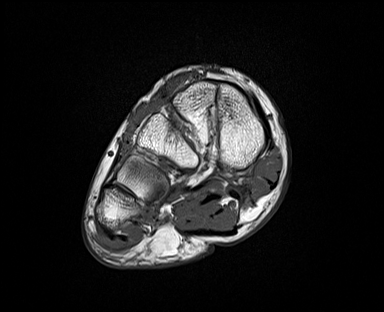
[im 45/45]
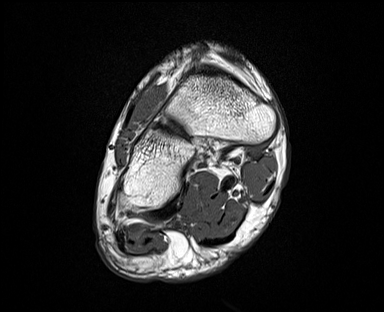

[Series 5: T2 fat-sat · coronal · right · 3.0mm · 0.31mm/px · 11 of 45 slices shown (1 of 2)]
[im 1/45]
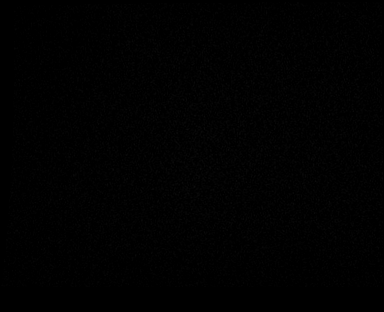
[im 5/45]
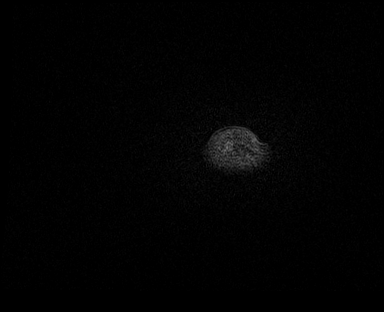
[im 9/45]
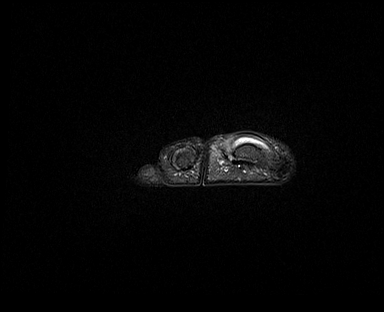
[im 14/45]
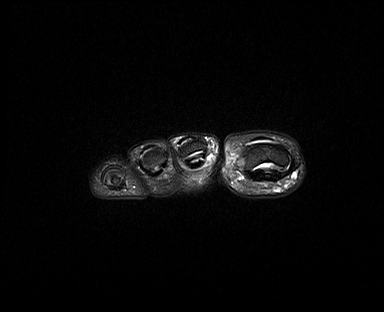
[im 18/45]
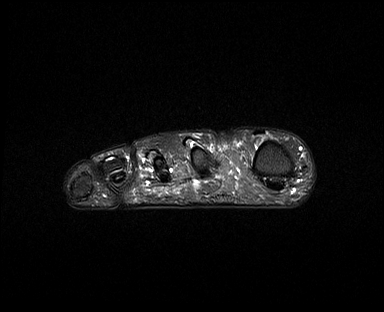
[im 23/45]
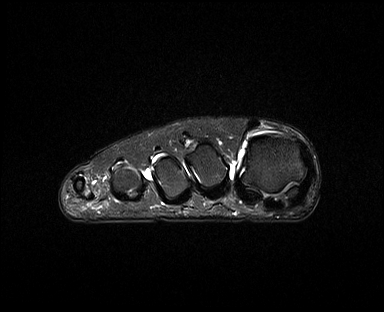
[im 27/45]
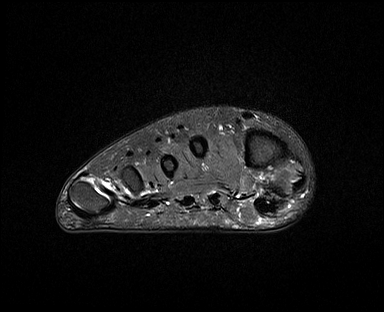
[im 31/45]
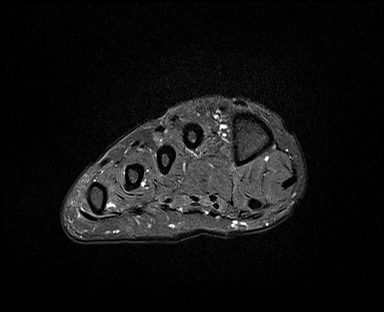
[im 36/45]
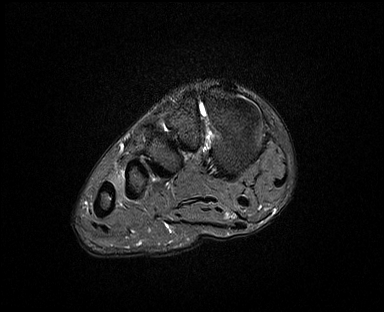
[im 40/45]
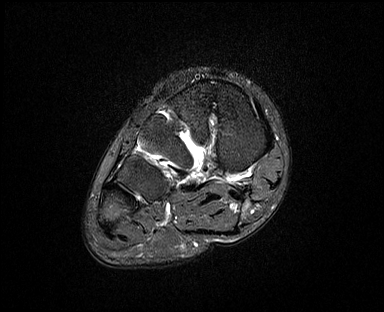
[im 45/45]
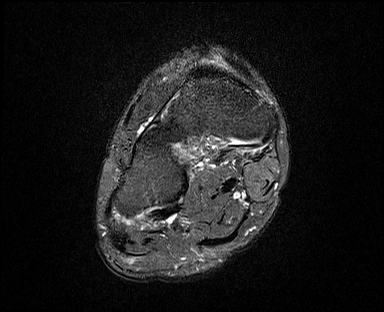

[Series 6: STIR · sagittal · right · 3.0mm · 0.70mm/px · 7 of 28 slices shown]
[im 1/28]
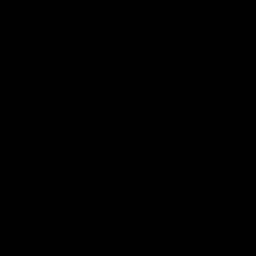
[im 5/28]
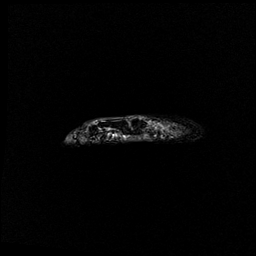
[im 10/28]
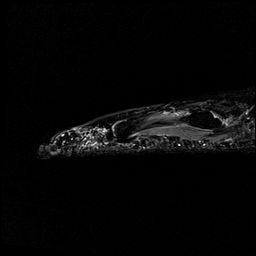
[im 14/28]
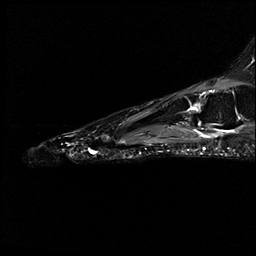
[im 19/28]
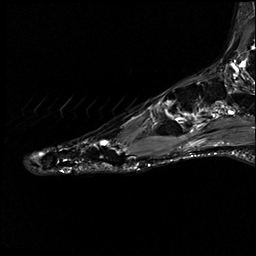
[im 23/28]
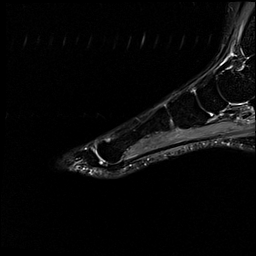
[im 28/28]
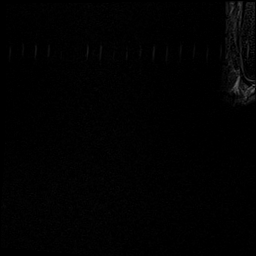

[Series 7: T1 · axial · right · 3.0mm · 0.47mm/px · z∈[-158,-79]mm · 6 of 23 slices shown (2 of 2)]
[im 1/23]
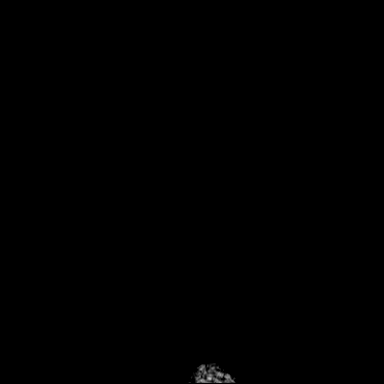
[im 5/23]
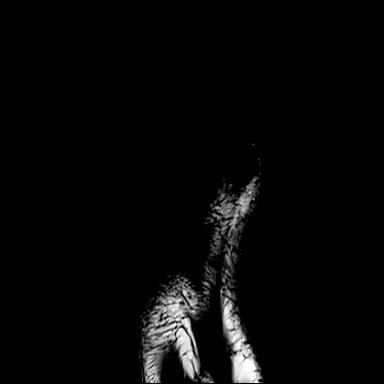
[im 9/23]
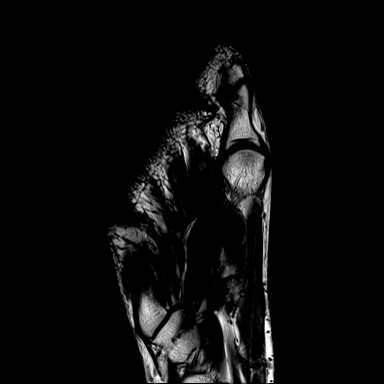
[im 14/23]
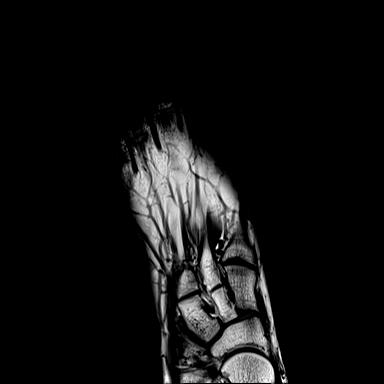
[im 18/23]
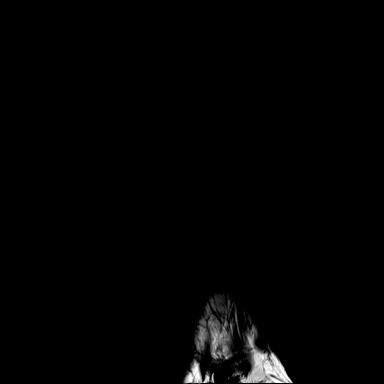
[im 23/23]
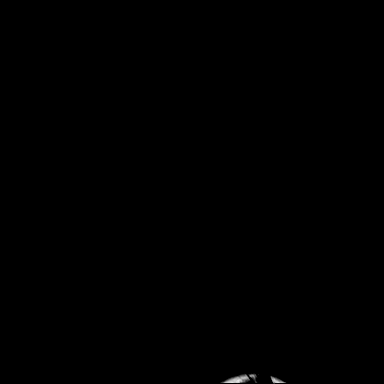

[Series 8: T2 fat-sat · axial · right · 3.0mm · 0.47mm/px · z∈[-158,-79]mm · 6 of 23 slices shown (2 of 2)]
[im 1/23]
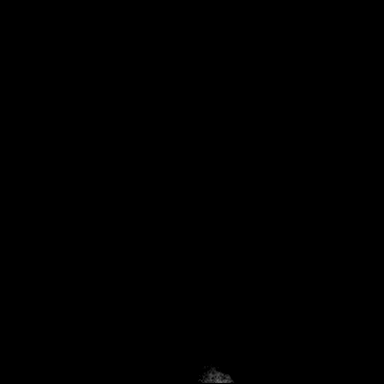
[im 5/23]
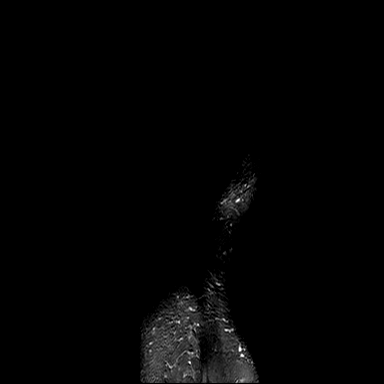
[im 9/23]
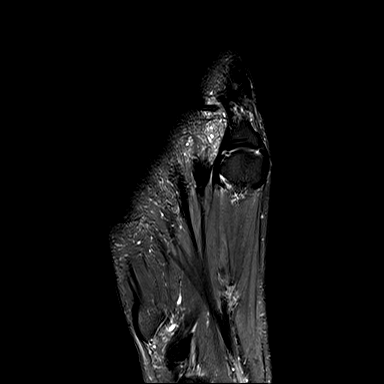
[im 14/23]
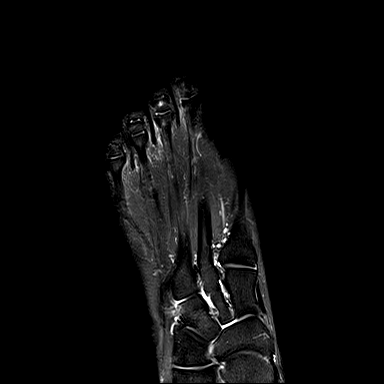
[im 18/23]
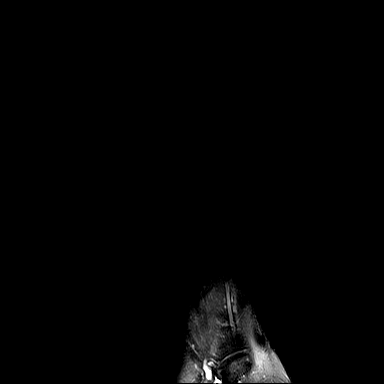
[im 23/23]
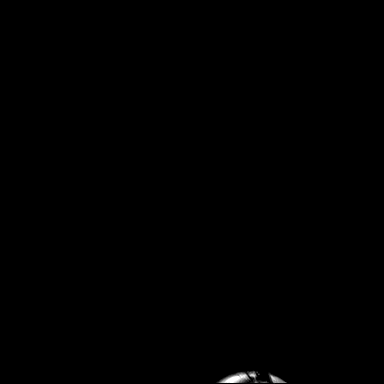

[40 of 40 positions shown; findings below may reference images not displayed]

FINDINGS: Bones/Joint/Cartilage

No marrow edema, periosteal reaction, or findings of stress
fracture. No acute bony findings. First digit sesamoids appear
intact. No bony malalignment.

Ligaments

Lisfranc ligament appears intact.

Muscles and Tendons

Unremarkable

Soft tissues

Sharply defined small fluid signal intensity structure measuring
by 0.2 by 0.4 cm just plantar and lateral to the base of the
proximal phalanx of the second toe. This does not have the
infiltrative appearance typical for Morton's neuroma, but could
reflect trace intermetatarsal bursitis extending plantar, or
conceivably a tiny ganglion cyst. Likewise there is some equivocal
intermetatarsal bursitis between the third and fourth metatarsal
heads.
IMPRESSION: 1. No stress fracture identified.
2. Trace intermetatarsal bursitis between the third and fourth
metatarsal heads. A very small fluid signal intensity structure
along the plantar-lateral margin of the base of the proximal phalanx
second toe may represent plantar extension of a small amount of
intermetatarsal bursitis, or a very small ganglion cyst. This does
not have a characteristic appearance for Morton's neuroma.

## 2020-09-12 DIAGNOSIS — K219 Gastro-esophageal reflux disease without esophagitis: Secondary | ICD-10-CM | POA: Insufficient documentation

## 2020-09-12 DIAGNOSIS — R09A2 Foreign body sensation, throat: Secondary | ICD-10-CM | POA: Insufficient documentation

## 2020-09-12 HISTORY — DX: Gastro-esophageal reflux disease without esophagitis: K21.9

## 2022-06-25 DIAGNOSIS — I3139 Other pericardial effusion (noninflammatory): Secondary | ICD-10-CM | POA: Diagnosis not present

## 2022-06-25 DIAGNOSIS — R079 Chest pain, unspecified: Secondary | ICD-10-CM | POA: Diagnosis not present

## 2022-06-26 DIAGNOSIS — I471 Supraventricular tachycardia, unspecified: Secondary | ICD-10-CM

## 2022-08-10 ENCOUNTER — Ambulatory Visit: Payer: 59 | Admitting: Student in an Organized Health Care Education/Training Program

## 2022-08-10 ENCOUNTER — Encounter: Payer: Self-pay | Admitting: Student in an Organized Health Care Education/Training Program

## 2022-08-10 VITALS — BP 126/74 | HR 81 | Temp 97.8°F | Ht 64.0 in | Wt 178.8 lb

## 2022-08-10 DIAGNOSIS — R0602 Shortness of breath: Secondary | ICD-10-CM

## 2022-08-10 LAB — NITRIC OXIDE: Nitric Oxide: 11

## 2022-08-10 MED ORDER — AIRSUPRA 90-80 MCG/ACT IN AERO: 2.0000 | INHALATION_SPRAY | RESPIRATORY_TRACT | Status: DC | PRN

## 2022-08-10 NOTE — Progress Notes (Signed)
Assessment & Plan:   1. SOB (shortness of breath)  Patient is presenting for the evaluation of exertional dyspnea in the setting of known ulcerative colitis, on Abatacept and sulfasalazine, and seronegative rheumatoid arthritis. She also carries a history of childhood asthma. She was recently diagnosed with pneumonia and treated with two rounds of steroids, antibiotics, and LABA/ICS inhalers.  I have reviewed her recent chest CT from May of 2024 and it does not show any signs of parenchymal lung disease or ILD. She does have a history of childhood asthma, and while FeNO in clinic today was normal at 11 ppb, she did recently receive steroids (oral and inhaled). I am suspicious of asthma as a driver behind her symptoms and will obtain a pulmonary function test to assess spirometry for any obstruction. I will also give her a sample of ICS/SABA (Air-supra) and assess her response on follow up.  - Nitric oxide normal at 11 ppb - Pulmonary Function Test [facility] Only; Future - ICS/SABA, airsupra two puffs every 4 hours as needed  Return in about 2 months (around 10/11/2022).  I spent 60 minutes caring for this patient today, including preparing to see the patient, obtaining a medical history , reviewing a separately obtained history, performing a medically appropriate examination and/or evaluation, counseling and educating the patient/family/caregiver, ordering medications, tests, or procedures, documenting clinical information in the electronic health record, and independently interpreting results (not separately reported/billed) and communicating results to the patient/family/caregiver  Raechel Chute, MD [facility] 08/10/2022 10:39 AM    End of visit medications:  Meds ordered this encounter  Medications   Albuterol-Budesonide (AIRSUPRA) 90-80 MCG/ACT AERO    Sig: Inhale 2 puffs into the lungs as needed.    Order Specific Question:   Lot Number?    Answer:   [id number]     Order Specific Question:   Expiration Date?    Answer:   07/27/2023    Order Specific Question:   Manufacturer?    Answer:   [employer] [[id number]]    Order Specific Question:   Quantity    Answer:   1     Current Outpatient Medications:    albuterol (PROVENTIL) (2.5 MG/3ML) 0.083% nebulizer solution, Take 2.5 mg by nebulization every 6 (six) hours as needed for wheezing or shortness of breath., Disp: , Rfl:    albuterol (VENTOLIN HFA) 108 (90 Base) MCG/ACT inhaler, Inhale into the lungs every 6 (six) hours as needed for wheezing or shortness of breath., Disp: , Rfl:    Albuterol-Budesonide (AIRSUPRA) 90-80 MCG/ACT AERO, Inhale 2 puffs into the lungs as needed., Disp: , Rfl:    folic acid (FOLVITE) 1 MG tablet, Take 1 mg by mouth 2 (two) times daily., Disp: , Rfl:    gabapentin (NEURONTIN) 300 MG capsule, Take 300 mg by mouth. Takes one tablet qhs, Disp: , Rfl:    HYDROcodone-acetaminophen (NORCO) 7.5-325 MG tablet, Take 1 tablet by mouth every 6 (six) hours as needed. for pain, Disp: , Rfl:    methocarbamol (ROBAXIN) 500 MG tablet, Take 500 mg by mouth 3 (three) times daily., Disp: , Rfl:    sulfaSALAzine (AZULFIDINE) 500 MG tablet, TAKE 2 TABLET TWICE DAILY, Disp: , Rfl:    torsemide (DEMADEX) 10 MG tablet, Take 10 mg by mouth daily. 1-2 tablests prn for swelling, Disp: , Rfl:    traMADol (ULTRAM) 50 MG tablet, Take by mouth., Disp: , Rfl:    Subjective:   PATIENT ID: Michelle Newman GENDER: female DOB:  14-May-1970, MRN: [id number]  Chief Complaint  Patient presents with   pulmonary consult    Hx of PNA 05/2022- lingering SOB with exertion.     HPI  Patient is a pleasant 52 year old female with a past medical history of ulcerative colitis and rheumatoid arthritis (seronegative) presenting for the evaluation of shortness of breath.  Patient reports that she had pneumonia in May of this year which required 2 courses of antibiotics and 2 courses of prednisone with improvement.  At the  time of her symptoms, she developed a cough, chest tightness (elephant on chest), and a significant wheeze. She was seen in the hospital and by her primary care physician and was prescribed antibiotics (levofloxacin) and prednisone.  Her symptoms have improved but she continues to have some residual shortness of breath. The shortness of breath is mostly with exertion. She does report some flank/lateral chest wall tightness in the morning that improves with the use her albuterol nebulizer. She denies any chest pain, chest tightness, cough, wheezing, sputum production.  Patient has a past medical history of ulcerative colitis maintained on multiple medications in the past. She is currently on Abatacept and Sulfasalazine.  She was previously placed on leflunomide which caused her to have Bell's palsy and that was around the time that she had "pneumonia". Following all of this, her primary care physician ordered a CT scan of the chest which was performed in May. While the report is not available to me for review I was able to review the images in our PACS and I do not see any pulmonary involvement with her disease. Patient also reports a past medical history of asthma and reports having had it for a long period of time.  She reports needing to use her albuterol a few times a year especially around pollen season. She was placed on Wixela around the time of this diagnosis with pneumonia which she feels helped a lot with her symptoms. She has since discontinued the [facility].  Patient denies smoking and does not report any occupational exposures. 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  Ancillary information including prior medications, full medical/surgical/family/social histories, and PFTs (when available) are listed below and have been reviewed.   Review of Systems  Constitutional:  Negative for chills, fever, malaise/fatigue and weight loss.  Respiratory:   Positive for cough and shortness of breath. Negative for hemoptysis, sputum production and wheezing.   Cardiovascular:  Negative for chest pain.  Skin:  Negative for rash.     Objective:   Vitals:   08/10/22 1003  BP: 126/74  Pulse: 81  Temp: 97.8 F (36.6 C)  TempSrc: Temporal  SpO2: 99%  Weight: 178 lb 12.8 oz (81.1 kg)  Height: 5\' 4"  (1.626 m)   99% on RA BMI Readings from Last 3 Encounters:  08/10/22 30.69 kg/m  09/05/18 28.68 kg/m   Wt Readings from Last 3 Encounters:  08/10/22 178 lb 12.8 oz (81.1 kg)  09/05/18 156 lb 12.8 oz (71.1 kg)    Physical Exam Constitutional:      Appearance: Normal appearance. She is obese. She is not ill-appearing.  Cardiovascular:     Rate and Rhythm: Normal rate and regular rhythm.     Pulses: Normal pulses.     Heart sounds: Normal heart sounds.  Pulmonary:     Effort: Pulmonary effort is normal. No respiratory distress.     Breath sounds: Normal breath sounds. No wheezing.  Abdominal:     Palpations: Abdomen is soft.  Neurological:     General: No focal deficit present.     Mental Status: She is alert and oriented to person, place, and time. Mental status is at baseline.       Ancillary Information    Past Medical History:  Diagnosis Date   Edema    Enteropathic arthritis    Hx of chronic ulcerative colitis    Hypertension    Insomnia    Other postherpetic nervous system involvement    Ovarian cyst    Peripheral edema      Family History  Problem Relation Age of Onset   Non-Hodgkin's lymphoma Mother        htn   Hypertension Mother    CAD Father      Past Surgical History:  Procedure Laterality Date   CHOLECYSTECTOMY     ex lap for ruptured ovarian cyst     hysterectomy with ovaries remaining      Social History   Socioeconomic History   Marital status: Married    Spouse name: Not on file   Number of children: 2   Years of education: 14   Highest education level: Not on file  Occupational  History   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 [employer]    Occupation: Charity fundraiser  Tobacco Use   Smoking status: Never   Smokeless tobacco: Never  Vaping Use   Vaping status: Never Used  Substance and Sexual Activity   Alcohol use: Never   Drug use: Never   Sexual activity: Yes  Other Topics Concern   Not on file  Social History Narrative   Married 2 sons and one girl   One story   Right handed    Social Determinants of Health   Financial Resource Strain: Not on file  Food Insecurity: Not on file  Transportation Needs: Not on file  Physical Activity: Not on file  Stress: Not on file  Social Connections: Not on file  Intimate Partner Violence: Not on file     Allergies  Allergen Reactions   Amoxicillin-Pot Clavulanate Nausea And Vomiting    GI bleeding noted within 24 hours of taking   Flexeril [Cyclobenzaprine]     Unable to urinate    Metoprolol Succinate [Metoprolol Tartrate]     Flushing      CBC No results found for: "WBC", "RBC", "HGB", "HCT", "PLT", "MCV", "MCH", "MCHC", "RDW", "LYMPHSABS", "MONOABS", "EOSABS", "BASOSABS"  Pulmonary Functions Testing Results:     No data to display          Outpatient Medications Prior to Visit  Medication Sig Dispense Refill   albuterol (PROVENTIL) (2.5 MG/3ML) 0.083% nebulizer solution Take 2.5 mg by nebulization every 6 (six) hours as needed for wheezing or shortness of breath.     albuterol (VENTOLIN HFA) 108 (90 Base) MCG/ACT inhaler Inhale into the lungs every 6 (six) hours as needed for wheezing or shortness of breath.     folic acid (FOLVITE) 1 MG tablet Take 1 mg by mouth 2 (two) times daily.     gabapentin (NEURONTIN) 300 MG capsule Take 300 mg by mouth. Takes one tablet qhs     HYDROcodone-acetaminophen (NORCO) 7.5-325 MG tablet Take 1 tablet by mouth every 6 (six) hours as needed. for pain     methocarbamol (ROBAXIN) 500 MG tablet Take 500 mg by mouth 3 (three) times daily.     sulfaSALAzine (AZULFIDINE) 500 MG tablet  TAKE 2 TABLET TWICE DAILY  torsemide (DEMADEX) 10 MG tablet Take 10 mg by mouth daily. 1-2 tablests prn for swelling     traMADol (ULTRAM) 50 MG tablet Take by mouth.     atenolol-chlorthalidone (TENORETIC) 50-25 MG tablet Take 0.5 tablets by mouth daily. Takes 1/2 tab 50/25mg      Golimumab (SIMPONI) 100 MG/ML SOSY Inject into the skin.     methylPREDNISolone (MEDROL DOSEPAK) 4 MG TBPK tablet Take as directed 21 tablet 0   No facility-administered medications prior to visit.

## 2022-08-20 ENCOUNTER — Encounter: Payer: Self-pay | Admitting: Student in an Organized Health Care Education/Training Program

## 2022-08-20 DIAGNOSIS — B370 Candidal stomatitis: Secondary | ICD-10-CM

## 2022-08-20 MED ORDER — CLOTRIMAZOLE 10 MG MT TROC: 10.0000 mg | Freq: Every day | OROMUCOSAL | 0 refills | Status: AC

## 2022-08-20 NOTE — Telephone Encounter (Signed)
I spoke to the patient over the phone and sent in a prescription for anti-fungal for thrush. Doesn't appear to be thrush but we will treat it as so just in case. thanks

## 2022-08-21 ENCOUNTER — Encounter: Payer: Self-pay | Admitting: Cardiology

## 2022-08-21 ENCOUNTER — Encounter: Payer: Self-pay | Admitting: *Deleted

## 2022-09-09 ENCOUNTER — Telehealth: Payer: Self-pay | Admitting: Student in an Organized Health Care Education/Training Program

## 2022-09-09 NOTE — Telephone Encounter (Signed)
Noted  

## 2022-09-09 NOTE — Telephone Encounter (Signed)
Thanks for letting me know!

## 2022-09-09 NOTE — Telephone Encounter (Signed)
Patient was seen 08/10/22 and PFT was ordered. Patient thought PFT was ordered as urgent. Patient stated when she checked out was told  someone should contact by 08/31/22 to schedule PFT.  When I called today the patient thought the PFT would have been done before her appt with cardiology on 09/15/22. Patient also stated we had poor communication in our office

## 2022-09-11 ENCOUNTER — Encounter: Payer: Self-pay | Admitting: Cardiology

## 2022-09-15 ENCOUNTER — Ambulatory Visit: Payer: 59 | Attending: Cardiology | Admitting: Cardiology

## 2022-09-15 ENCOUNTER — Ambulatory Visit: Payer: 59 | Attending: Cardiology

## 2022-09-15 ENCOUNTER — Encounter: Payer: Self-pay | Admitting: Cardiology

## 2022-09-15 VITALS — BP 122/80 | HR 81 | Ht 64.0 in | Wt 179.4 lb

## 2022-09-15 DIAGNOSIS — R0789 Other chest pain: Secondary | ICD-10-CM

## 2022-09-15 DIAGNOSIS — I3139 Other pericardial effusion (noninflammatory): Secondary | ICD-10-CM | POA: Diagnosis not present

## 2022-09-15 DIAGNOSIS — R0602 Shortness of breath: Secondary | ICD-10-CM

## 2022-09-15 DIAGNOSIS — R0683 Snoring: Secondary | ICD-10-CM

## 2022-09-15 DIAGNOSIS — K51919 Ulcerative colitis, unspecified with unspecified complications: Secondary | ICD-10-CM

## 2022-09-15 DIAGNOSIS — R0609 Other forms of dyspnea: Secondary | ICD-10-CM | POA: Insufficient documentation

## 2022-09-15 DIAGNOSIS — M069 Rheumatoid arthritis, unspecified: Secondary | ICD-10-CM | POA: Insufficient documentation

## 2022-09-15 DIAGNOSIS — M0600 Rheumatoid arthritis without rheumatoid factor, unspecified site: Secondary | ICD-10-CM

## 2022-09-15 NOTE — Addendum Note (Signed)
Addended by: Baldo Ash D on: 09/15/2022 02:44 PM   Modules accepted: Orders

## 2022-09-15 NOTE — Progress Notes (Signed)
Cardiology Consultation:    Date:  09/15/2022   ID:  Dlila, Galanis Jul 31, 1970, MRN [id number]  PCP:  Philemon Kingdom, MD  Cardiologist:  Gypsy Balsam, MD   Referring MD: Philemon Kingdom, MD   Chief Complaint  Patient presents with   Pericardial Effusion   Shortness of Breath   Chest Pain   Palpitations   Excessive Sweating   Sleep Apnea    History of Present Illness:    Michelle Newman is a 52 y.o. female who is being seen today for the evaluation of multiple complaints at the request of Prochnau, Rayfield Citizen, MD. past medical history significant for rheumatoid arthritis that she had been suffering for from for about 15 years, also ulcerative colitis.  At the beginning of this year she started complaining of having some chest heaviness.  She had up going to the round of hospital.  Echocardiogram being done showed trivial MR 3, [place], she was also find to have small pericardial effusion, stress test being done at that time which was negative for evidence of exercise-induced myocardial ischemia.  She was referred to [place] for evaluation of her heart leading complaint right now is shortness of breath.  Does look like she gets short of breath quite easily walking to the second floor will give her shortness of breath.  She did see pulmonary and CT of the chest has been done is unrevealing.  But she is scheduled to have pulmonary function test which I think is an excellent idea.  She denies having any chest pain right now again bleeding problem shortness of breath.  Does not smoke, does not exercise on the regular basis Very sedentary lifestyle.  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  States a lot of sitting.  Does have family history of heart issue with valve problem.  Past Medical History:  Diagnosis Date   Edema    Enteropathic arthritis    Gastroesophageal reflux disease 09/12/2020   Hx of chronic ulcerative colitis     Hypertension    Insomnia    Osteoarthritis    Other postherpetic nervous system involvement    Ovarian cyst    Peripheral edema    Rheumatoid arthritis (HCC)    Ulcerative colitis (HCC) 10/05/2016    Past Surgical History:  Procedure Laterality Date   CESAREAN SECTION     x2   CHOLECYSTECTOMY     ex lap for ruptured ovarian cyst Left    hysterectomy with ovaries remaining     LUMBAR FUSION     L5-S1   Neck fusion     C5-6-7   Torn meniscus repair Right     Current Medications: Current Meds  Medication Sig   albuterol (PROVENTIL) (2.5 MG/3ML) 0.083% nebulizer solution Take 2.5 mg by nebulization every 6 (six) hours as needed for wheezing or shortness of breath.   albuterol (VENTOLIN HFA) 108 (90 Base) MCG/ACT inhaler Inhale 2 puffs into the lungs every 6 (six) hours as needed for wheezing or shortness of breath.   Albuterol-Budesonide (AIRSUPRA) 90-80 MCG/ACT AERO Inhale 2 puffs into the lungs as needed. (Patient taking differently: Inhale 2 puffs into the lungs as needed (SOB).)   certolizumab pegol (CIMZIA) 2 X 200 MG KIT Inject 200 mg into the skin every 30 (thirty) days.   fluticasone-salmeterol (WIXELA INHUB) 100-50 MCG/ACT AEPB Inhale 1 puff into the lungs 2 (two) times daily.   folic acid (FOLVITE) 1 MG tablet Take 2  mg by mouth daily.   furosemide (LASIX) 40 MG tablet Take 40 mg by mouth daily.   gabapentin (NEURONTIN) 300 MG capsule Take 300 mg by mouth at bedtime as needed (sleep). Takes one tablet qhs   HYDROcodone-acetaminophen (NORCO/VICODIN) 5-325 MG tablet Take 1 tablet by mouth 2 (two) times daily as needed for moderate pain or severe pain. for pain   hydrocortisone (ANUSOL-HC) 2.5 % rectal cream Place 1 Application rectally 2 (two) times daily.   hydroxychloroquine (PLAQUENIL) 200 MG tablet Take 200 mg by mouth 2 (two) times daily.   methocarbamol (ROBAXIN) 500 MG tablet Take 500 mg by mouth in the morning and at bedtime.   pregabalin (LYRICA) 75 MG capsule  Take 75 mg by mouth daily.   propranolol ER (INDERAL LA) 80 MG 24 hr capsule Take 80 mg by mouth daily.   spironolactone (ALDACTONE) 25 MG tablet Take 12.5 mg by mouth 2 (two) times daily.   sulfaSALAzine (AZULFIDINE) 500 MG tablet Take 1,000 mg by mouth 2 (two) times daily.   traMADol (ULTRAM) 50 MG tablet Take 50 mg by mouth 2 (two) times daily as needed for moderate pain.   triamcinolone cream (KENALOG) 0.1 % Apply 1 Application topically 2 (two) times daily as needed (rash).   [DISCONTINUED] Abatacept (ORENCIA) 125 MG/ML SOSY Inject 1 mL into the skin once a week.     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 [employer]    Occupation: Charity fundraiser  Tobacco Use   Smoking status: Never   Smokeless tobacco: Never  Vaping Use   Vaping status: Never Used  Substance and Sexual Activity   Alcohol use: Never   Drug use: Never   Sexual activity: Yes  Other Topics Concern   Not on file  Social History Narrative   Married 2 sons and one girl   One story   Right handed    Social Determinants of Health   Financial Resource Strain: Not on file  Food Insecurity: Not on file  Transportation Needs: Not on file  Physical Activity: Not on file  Stress: Not on file  Social Connections: Not on file     Family History: The patient's family history includes CAD in her father; Heart attack in her father; Hypertension in her father and mother; Non-Hodgkin's lymphoma in her mother. ROS:   Please see the history of present illness.    All 14 point review of systems negative except as described per history of present  illness.  EKGs/Labs/Other Studies Reviewed:    The following studies were reviewed today: Echocardiogram done in the hospital showed left ventricle ejection fraction 55 to 60%, trivial MR trivial TR, trace amount of localized pericardial effusion inferiorly and posteriorly.  Stress test showed no evidence of ischemia  EKG: Normal sinus rhythm normal P interval, PVCs, nonspecific ST segment changes      Recent Labs: No results found for requested labs within last 365 days.  Recent Lipid Panel No results found for: "CHOL", "TRIG", "HDL", "CHOLHDL", "VLDL", "LDLCALC", "LDLDIRECT"  Physical Exam:    VS:  BP 122/80 (BP Location: Left Arm, Patient Position: Sitting)   Pulse 81   Ht  5\' 4"  (1.626 m)   Wt 179 lb 6.4 oz (81.4 kg)   SpO2 92%   BMI 30.79 kg/m     Wt Readings from Last 3 Encounters:  09/15/22 179 lb 6.4 oz (81.4 kg)  08/04/22 176 lb (79.8 kg)  08/10/22 178 lb 12.8 oz (81.1 kg)     GEN:  Well nourished, well developed in no acute distress HEENT: Normal NECK: No JVD; No carotid bruits LYMPHATICS: No lymphadenopathy CARDIAC: RRR, no murmurs, no rubs, no gallops RESPIRATORY:  Clear to auscultation without rales, wheezing or rhonchi  ABDOMEN: Soft, non-tender, non-distended MUSCULOSKELETAL:  No edema; No deformity  SKIN: Warm and dry NEUROLOGIC:  Alert and oriented x 3 PSYCHIATRIC:  Normal affect   ASSESSMENT:    1. SOB (shortness of breath)   2. Pericardial effusion   3. Atypical chest pain   4. Dyspnea on exertion   5. Ulcerative colitis with complication, unspecified location (HCC)   6. Rheumatoid arthritis with negative rheumatoid factor, involving unspecified site [facility])    PLAN:    In order of problems listed above:  Shortness of breath unclear etiology for now.  Echocardiogram does not explain it.  Fluid is only trace in pericardium that should not create a problem I will repeat however echocardiogram make sure that there is no worsening of her fluid will  also try to the time and the thickness of the pericardium, sedimentation rate and C-reactive protein will be done as well.  Anxiously waiting for her pulmonary function test to see if that would explain her symptomatology.  So far look like systolic ejection fraction is normal, diastolic function is decent, her stress test is negative so I do not see cardiac explanation for his dyspnea on exertion. Pericardial effusion only trace.  Will repeat echocardiogram.  Will look for inflammatory markers however honestly it will be difficult to judge in significance of those markers since she does have rheumatoid arthritis which obviously is chronic inflammatory process as well as ulcerative colitis. Palpitations.  She tells me that she does have a lot of skipped beats she describes as fish in her chest some sustained.  EKG today shows some PVCs.  Will schedule her to have Zio patch. Sleep apnea her husband does have sleep apnea and she tells me that she snores a lot I strongly suspect she has it also profound weakness fatigue which could be related to inflammatory disease but could be due to obstructive sleep apnea therefore sleep study will be done to rule it out   Medication Adjustments/Labs and Tests Ordered: Current medicines are reviewed at length with the patient today.  Concerns regarding medicines are outlined above.  Orders Placed This Encounter  Procedures   EKG 12-Lead   No orders of the defined types were placed in this encounter.   Signed, Georgeanna Lea, MD, [facility] 09/15/2022 2:22 PM    [facility]

## 2022-09-15 NOTE — Patient Instructions (Signed)
Medication Instructions:  Your physician recommends that you continue on your current medications as directed. Please refer to the Current Medication list given to you today.  *If you need a refill on your cardiac medications before your next appointment, please call your pharmacy*   Lab Work: Sed Rate, C-Reactive Protein- today If you have labs (blood work) drawn today and your tests are completely normal, you will receive your results only by: MyChart Message (if you have MyChart) OR A paper copy in the mail If you have any lab test that is abnormal or we need to change your treatment, we will call you to review the results.   Testing/Procedures:  Your physician has requested that you have an echocardiogram. Echocardiography is a painless test that uses sound waves to create images of your heart. It provides your doctor with information about the size and shape of your heart and how well your heart's chambers and valves are working. This procedure takes approximately one hour. There are no restrictions for this procedure. Please do NOT wear cologne, perfume, aftershave, or lotions (deodorant is allowed). Please arrive 15 minutes prior to your appointment time.   WHY IS MY DOCTOR PRESCRIBING ZIO? The Zio system is proven and trusted by physicians to detect and diagnose irregular heart rhythms -- and has been prescribed to hundreds of thousands of patients.  The FDA has cleared the Zio system to monitor for many different kinds of irregular heart rhythms. In a study, physicians were able to reach a diagnosis 90% of the time with the Zio system1.  You can wear the Zio monitor -- a small, discreet, comfortable patch -- during your normal day-to-day activity, including while you sleep, shower, and exercise, while it records every single heartbeat for analysis.  1Barrett, P., et al. Comparison of 24 Hour Holter Monitoring Versus 14 Day Novel Adhesive Patch Electrocardiographic Monitoring.  American Journal of Medicine, 2014.  ZIO VS. HOLTER MONITORING The Zio monitor can be comfortably worn for up to 14 days. Holter monitors can be worn for 24 to 48 hours, limiting the time to record any irregular heart rhythms you may have. Zio is able to capture data for the 51% of patients who have their first symptom-triggered arrhythmia after 48 hours.1  LIVE WITHOUT RESTRICTIONS The Zio ambulatory cardiac monitor is a small, unobtrusive, and water-resistant patch--you might even forget you're wearing it. The Zio monitor records and stores every beat of your heart, whether you're sleeping, working out, or showering.    [place] Sleep Study- Will call when insurance authorizes   Follow-Up: At [facility], you and your health needs are our priority.  As part of our continuing mission to provide you with exceptional heart care, we have created designated Provider Care Teams.  These Care Teams include your primary Cardiologist (physician) and Advanced Practice Providers (APPs -  Physician Assistants and Nurse Practitioners) who all work together to provide you with the care you need, when you need it.  We recommend signing up for the patient portal called "MyChart".  Sign up information is provided on this After Visit Summary.  MyChart is used to connect with patients for Virtual Visits (Telemedicine).  Patients are able to view lab/test results, encounter notes, upcoming appointments, etc.  Non-urgent messages can be sent to your provider as well.   To learn more about what you can do with MyChart, go to [email or web address].    Your next appointment:   2 month(s)  The format for your next appointment:  In Person  Provider:   Gypsy Balsam, MD    Other Instructions NA

## 2022-09-16 LAB — C-REACTIVE PROTEIN: CRP: <1 mg/L (ref 0–10)

## 2022-09-16 LAB — SEDIMENTATION RATE: Sed Rate: 2 mm/h (ref 0–32)

## 2022-09-17 ENCOUNTER — Telehealth: Payer: Self-pay

## 2022-09-17 NOTE — Telephone Encounter (Signed)
Spoke with pt regarding lab results per Dr. Vanetta Shawl note. Pt verbalized understanding and had no further questions. Routed to PCP.

## 2022-09-25 ENCOUNTER — Ambulatory Visit: Payer: 59 | Attending: Cardiology

## 2022-09-25 DIAGNOSIS — R0602 Shortness of breath: Secondary | ICD-10-CM | POA: Diagnosis not present

## 2022-09-25 LAB — ECHOCARDIOGRAM COMPLETE
Area-P 1/2: 3.57 cm2
S' Lateral: 3.2 cm

## 2022-09-29 ENCOUNTER — Ambulatory Visit: Payer: 59 | Attending: Student in an Organized Health Care Education/Training Program

## 2022-09-29 ENCOUNTER — Telehealth: Payer: Self-pay | Admitting: Cardiology

## 2022-09-29 DIAGNOSIS — I998 Other disorder of circulatory system: Secondary | ICD-10-CM | POA: Diagnosis not present

## 2022-09-29 DIAGNOSIS — R0602 Shortness of breath: Secondary | ICD-10-CM | POA: Diagnosis present

## 2022-09-29 LAB — PULMONARY FUNCTION TEST ARMC ONLY
DL/VA % pred: 77 %
DL/VA: 3.34 ml/min/mmHg/L
DLCO unc % pred: 72 %
DLCO unc: 15.36 ml/min/mmHg
FEF 25-75 Post: 3.41 L/s
FEF 25-75 Pre: 3.65 L/s
FEF2575-%Change-Post: -6 %
FEF2575-%Pred-Post: 123 %
FEF2575-%Pred-Pre: 132 %
FEV1-%Change-Post: -2 %
FEV1-%Pred-Post: 103 %
FEV1-%Pred-Pre: 105 %
FEV1-Post: 2.91 L
FEV1-Pre: 2.97 L
FEV1FVC-%Change-Post: 0 %
FEV1FVC-%Pred-Pre: 107 %
FEV6-%Change-Post: -2 %
FEV6-%Pred-Post: 97 %
FEV6-%Pred-Pre: 99 %
FEV6-Post: 3.36 L
FEV6-Pre: 3.44 L
FEV6FVC-%Pred-Post: 102 %
FEV6FVC-%Pred-Pre: 102 %
FVC-%Change-Post: -2 %
FVC-%Pred-Post: 94 %
FVC-%Pred-Pre: 97 %
FVC-Post: 3.36 L
FVC-Pre: 3.44 L
Post FEV1/FVC ratio: 87 %
Post FEV6/FVC ratio: 100 %
Pre FEV1/FVC ratio: 86 %
Pre FEV6/FVC Ratio: 100 %
RV % pred: 118 %
RV: 2.12 L
TLC % pred: 107 %
TLC: 5.44 L

## 2022-09-29 MED ORDER — ALBUTEROL SULFATE (2.5 MG/3ML) 0.083% IN NEBU
2.5000 mg | INHALATION_SOLUTION | Freq: Once | RESPIRATORY_TRACT | Status: AC
  Administered 2022-09-29: 2.5 mg via RESPIRATORY_TRACT
  Filled 2022-09-29: qty 3

## 2022-09-29 NOTE — Telephone Encounter (Signed)
Pt states that she would ike a callback regarding recent Echo results. Pt states she would like to discuss treatment plan and next step. Please advise

## 2022-09-30 ENCOUNTER — Telehealth: Payer: Self-pay

## 2022-09-30 NOTE — Telephone Encounter (Signed)
Patient notified through my chart.

## 2022-09-30 NOTE — Telephone Encounter (Signed)
-----   Message from Gypsy Balsam sent at 09/30/2022  9:31 AM EDT ----- Echocardiogram showed preserved left ventricle ejection fraction, overall looks good

## 2022-09-30 NOTE — Telephone Encounter (Signed)
Spoke with pt and advised per Dr. Bing Matter that her Echo was normal. She stated that she was having swelling in her legs, and increased sweating and palpitations. She wanted to be seen sooner than October. Sent to front desk for work in appt.

## 2022-10-05 ENCOUNTER — Encounter: Payer: Self-pay | Admitting: Cardiology

## 2022-10-08 ENCOUNTER — Encounter: Payer: Self-pay | Admitting: Cardiology

## 2022-10-08 ENCOUNTER — Ambulatory Visit: Payer: 59 | Attending: Cardiology | Admitting: Cardiology

## 2022-10-08 VITALS — BP 150/80 | HR 100 | Ht 64.0 in | Wt 177.0 lb

## 2022-10-08 DIAGNOSIS — R0609 Other forms of dyspnea: Secondary | ICD-10-CM

## 2022-10-08 DIAGNOSIS — R0789 Other chest pain: Secondary | ICD-10-CM | POA: Diagnosis not present

## 2022-10-08 DIAGNOSIS — M0600 Rheumatoid arthritis without rheumatoid factor, unspecified site: Secondary | ICD-10-CM | POA: Diagnosis not present

## 2022-10-08 DIAGNOSIS — I3139 Other pericardial effusion (noninflammatory): Secondary | ICD-10-CM

## 2022-10-08 DIAGNOSIS — R072 Precordial pain: Secondary | ICD-10-CM

## 2022-10-08 DIAGNOSIS — K219 Gastro-esophageal reflux disease without esophagitis: Secondary | ICD-10-CM

## 2022-10-08 MED ORDER — CARVEDILOL 6.25 MG PO TABS: 6.2500 mg | ORAL_TABLET | Freq: Two times a day (BID) | ORAL | 3 refills | Status: DC

## 2022-10-08 NOTE — Progress Notes (Signed)
Cardiology Office Note:    Date:  10/08/2022   ID:  Michelle Newman, DOB 24-Feb-1970, MRN [id number]  PCP:  Philemon Kingdom, MD  Cardiologist:  Gypsy Balsam, MD    Referring MD: Philemon Kingdom, MD   Chief Complaint  Patient presents with   Edema   Weight Gain   Palpitations   Fatigue    History of Present Illness:    Michelle Newman is a 52 y.o. female with very complex past medical history.  She does have rheumatoid arthritis that she been suffering for about 15 years she tells me that she fell multiple medication so far.  Also ulcerative colitis, she was referred to me because of atypical chest pain.  She did have a stress test done in the hospital which was negative, she also had echocardiogram done then which showed trace pericardial effusion.  She comes today to me for follow-up she is not doing well she complain of having a lot of swelling given that on physical exam I do not see any today.  She takes some diuretic which seems to be helping quite significantly.  She also noted to have some chest pain with some atypical and some suspicious characteristic.  She described pain as heaviness in the chest that is worse sometimes with taking deep breath but not always.  Past Medical History:  Diagnosis Date   Edema    Enteropathic arthritis    Gastroesophageal reflux disease 09/12/2020   Hx of chronic ulcerative colitis    Hypertension    Insomnia    Osteoarthritis    Other postherpetic nervous system involvement    Ovarian cyst    Peripheral edema    Rheumatoid arthritis (HCC)    Ulcerative colitis (HCC) 10/05/2016    Past Surgical History:  Procedure Laterality Date   CESAREAN SECTION     x2   CHOLECYSTECTOMY     ex lap for ruptured ovarian cyst Left    hysterectomy with ovaries remaining     LUMBAR FUSION     L5-S1   Neck fusion     C5-6-7   Torn meniscus repair Right     Current Medications: Current Meds  Medication Sig   albuterol  (PROVENTIL) (2.5 MG/3ML) 0.083% nebulizer solution Take 2.5 mg by nebulization every 6 (six) hours as needed for wheezing or shortness of breath.   albuterol (VENTOLIN HFA) 108 (90 Base) MCG/ACT inhaler Inhale 2 puffs into the lungs every 6 (six) hours as needed for wheezing or shortness of breath.   certolizumab pegol (CIMZIA) 2 X 200 MG KIT Inject 200 mg into the skin every 30 (thirty) days.   folic acid (FOLVITE) 1 MG tablet Take 2 mg by mouth daily.   gabapentin (NEURONTIN) 300 MG capsule Take 300 mg by mouth at bedtime as needed (sleep). Takes one tablet qhs   HYDROcodone-acetaminophen (NORCO/VICODIN) 5-325 MG tablet Take 1 tablet by mouth 2 (two) times daily as needed for moderate pain or severe pain. for pain   hydrocortisone (ANUSOL-HC) 2.5 % rectal cream Place 1 Application rectally 2 (two) times daily.   hydroxychloroquine (PLAQUENIL) 200 MG tablet Take 200 mg by mouth 2 (two) times daily.   methocarbamol (ROBAXIN) 500 MG tablet Take 500 mg by mouth in the morning and at bedtime.   pregabalin (LYRICA) 75 MG capsule Take 75 mg by mouth daily.   propranolol ER (INDERAL LA) 80 MG 24 hr capsule Take 80 mg by mouth daily.   spironolactone (ALDACTONE) 25 MG tablet  Take 12.5 mg by mouth 2 (two) times daily.   sulfaSALAzine (AZULFIDINE) 500 MG tablet Take 1,000 mg by mouth 2 (two) times daily.   traMADol (ULTRAM) 50 MG tablet Take 50 mg by mouth 2 (two) times daily as needed for moderate pain.   triamcinolone cream (KENALOG) 0.1 % Apply 1 Application topically 2 (two) times daily as needed (rash).     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 [employer]    Occupation: Charity fundraiser  Tobacco Use   Smoking status: Never   Smokeless tobacco: Never  Vaping Use   Vaping status: Never Used  Substance and Sexual Activity   Alcohol use: Never   Drug use: Never   Sexual activity: Yes  Other Topics Concern   Not on file  Social History Narrative   Married 2 sons and one girl   One story   Right handed    Social Determinants of Health   Financial Resource Strain: Not on file  Food Insecurity: Not on file  Transportation Needs: Not on file  Physical Activity: Not on file  Stress: Not on file  Social Connections: Not on file     Family History: The patient's family history includes CAD in her father; Heart attack in her father; Hypertension in her father and mother; Non-Hodgkin's lymphoma in her mother. ROS:   Please see the history of present illness.    All 14 point review of systems negative except as described per history of present illness  EKGs/Labs/Other Studies Reviewed:         Recent Labs: No results found for requested labs within last 365 days.  Recent Lipid Panel No results found for: "CHOL", "TRIG", "HDL", "CHOLHDL", "VLDL", "LDLCALC", "LDLDIRECT"  Physical Exam:    VS:  BP (!) 150/80 (BP Location: Left Arm, Patient Position: Sitting)   Pulse 100   Ht 5\' 4"  (1.626 m)   Wt 177 lb (80.3 kg)   SpO2 94%   BMI 30.38 kg/m     Wt Readings from Last 3 Encounters:  10/08/22 177 lb (80.3 kg)  09/15/22 179 lb 6.4 oz (81.4 kg)  08/04/22 176 lb (79.8 kg)     GEN:  Well nourished, well developed in no acute distress HEENT: Normal NECK: No JVD; No carotid bruits LYMPHATICS: No lymphadenopathy CARDIAC: RRR, no murmurs, no rubs, no gallops RESPIRATORY:  Clear to auscultation without rales, wheezing or rhonchi  ABDOMEN: Soft, non-tender, non-distended MUSCULOSKELETAL:  No edema; No deformity  SKIN: Warm and dry LOWER EXTREMITIES: no swelling NEUROLOGIC:  Alert and oriented x  3 PSYCHIATRIC:  Normal affect   ASSESSMENT:    1. Atypical chest pain   2. Dyspnea on exertion   3. Rheumatoid arthritis with negative rheumatoid factor, involving unspecified site (HCC)   4. Gastroesophageal reflux disease, unspecified whether esophagitis present   5. Pericardial effusion    PLAN:    In order of problems listed above:  Atypical chest pain.  Stress test negative however her symptoms are somewhat worrisome.  I will schedule him to have coronary CT angio. She was very concerned about her echocardiogram which showed diastolic dysfunction explained to her what  that means.  She was also concerned that previously she did not have it however she did have previously relaxation of her mouth which is stage I of diastolic sphincter dysfunction which is basically the same thing.  She was relief of learning that.  I advised her to continue with present dose of diuretics.  Will wait for results of her coronary CT angio for future guidance, she did have pulmonary function test done which I am anxiously waiting to see. Rheumatoid arthritis.  Her pain does have some pleuritic characteristic on top of that she did have previously trace of pericardial effusion I am wondering if her rheumatoid arthritis need to be a lipid better controlled.  Likely she does have appoint with rheumatologist pretty soon.   Medication Adjustments/Labs and Tests Ordered: Current medicines are reviewed at length with the patient today.  Concerns regarding medicines are outlined above.  No orders of the defined types were placed in this encounter.  Medication changes: No orders of the defined types were placed in this encounter.   Signed, Georgeanna Lea, MD, [facility] 10/08/2022 10:57 AM    [facility]

## 2022-10-08 NOTE — Patient Instructions (Addendum)
Medication Instructions:   STOP: Propranolol  START: Carvedilol 6.25mg  twice daily  TAKE: Carvedilol 25mg  (4 tablets) on the day of the CT Scan   Lab Work: BMP- today If you have labs (blood work) drawn today and your tests are completely normal, you will receive your results only by: MyChart Message (if you have MyChart) OR A paper copy in the mail If you have any lab test that is abnormal or we need to change your treatment, we will call you to review the results.   Testing/Procedures:  Your cardiac CT will be scheduled at one of the below locations:   [facility] [address] [place], [place] [address] [phone]  If scheduled at [facility], please arrive at the [facility] and Children's Entrance (Entrance C2) of [facility] 30 minutes prior to test start time. You can use the FREE valet parking offered at entrance C (encouraged to control the heart rate for the test)  Proceed to the [facility] Radiology Department (first floor) to check-in and test prep.  All radiology patients and guests should use entrance C2 at [facility], accessed from [facility], even though the hospital's physical address listed is [address]      Please follow these instructions carefully (unless otherwise directed):   On the Night Before the Test: Be sure to Drink plenty of water. Do not consume any caffeinated/decaffeinated beverages or chocolate 12 hours prior to your test. Do not take any antihistamines 12 hours prior to your test.   On the Day of the Test: Drink plenty of water until 1 hour prior to the test. Do not eat any food 4 hours prior to the test. You may take your regular medications prior to the test.  Take Carvedilol the morning of test HOLD Furosemide/Hydrochlorothiazide morning of the test. FEMALES- please wear underwire-free bra if available, avoid dresses & tight clothing       After the Test: Drink  plenty of water. After receiving IV contrast, you may experience a mild flushed feeling. This is normal. On occasion, you may experience a mild rash up to 24 hours after the test. This is not dangerous. If this occurs, you can take Benadryl 25 mg and increase your fluid intake. If you experience trouble breathing, this can be serious. If it is severe call 911 IMMEDIATELY. If it is mild, please call our office. If you take any of these medications: Glipizide/Metformin, Avandament, Glucavance, please do not take 48 hours after completing test unless otherwise instructed.  We will call to schedule your test 2-4 weeks out understanding that some insurance companies will need an authorization prior to the service being performed.   For non-scheduling related questions, please contact the cardiac imaging nurse navigator should you have any questions/concerns: Rockwell Alexandria, Cardiac Imaging Nurse Navigator Larey Brick, Cardiac Imaging Nurse Navigator [facility] Direct Office Dial: [phone]   For scheduling needs, including cancellations and rescheduling, please call [place], [phone].    Follow-Up: At [facility], you and your health needs are our priority.  As part of our continuing mission to provide you with exceptional heart care, we have created designated Provider Care Teams.  These Care Teams include your primary Cardiologist (physician) and Advanced Practice Providers (APPs -  Physician Assistants and Nurse Practitioners) who all work together to provide you with the care you need, when you need it.  We recommend signing up for the patient portal called "  MyChart".  Sign up information is provided on this After Visit Summary.  MyChart is used to connect with patients for Virtual Visits (Telemedicine).  Patients are able to view lab/test results, encounter notes, upcoming appointments, etc.  Non-urgent messages can be sent to your provider as well.   To  learn more about what you can do with MyChart, go to [email or web address].    Your next appointment:   3 month(s)  The format for your next appointment:   In Person  Provider:   Gypsy Balsam, MD    Other Instructions NA

## 2022-10-08 NOTE — Addendum Note (Signed)
Addended by: Baldo Ash D on: 10/08/2022 11:23 AM   Modules accepted: Orders

## 2022-10-09 LAB — BASIC METABOLIC PANEL
BUN/Creatinine Ratio: 19 (ref 9–23)
BUN: 17 mg/dL (ref 6–24)
CO2: 28 mmol/L (ref 20–29)
Calcium: 9.5 mg/dL (ref 8.7–10.2)
Chloride: 98 mmol/L (ref 96–106)
Creatinine, Ser: 0.89 mg/dL (ref 0.57–1.00)
Glucose: 80 mg/dL (ref 70–99)
Potassium: 4.4 mmol/L (ref 3.5–5.2)
Sodium: 141 mmol/L (ref 134–144)
eGFR: 79 mL/min/{1.73_m2} (ref >59)

## 2022-10-12 ENCOUNTER — Ambulatory Visit (INDEPENDENT_AMBULATORY_CARE_PROVIDER_SITE_OTHER): Payer: 59 | Admitting: Student in an Organized Health Care Education/Training Program

## 2022-10-12 ENCOUNTER — Encounter: Payer: Self-pay | Admitting: Student in an Organized Health Care Education/Training Program

## 2022-10-12 VITALS — BP 130/80 | HR 88 | Temp 97.6°F | Ht 64.0 in | Wt 183.2 lb

## 2022-10-12 DIAGNOSIS — R0602 Shortness of breath: Secondary | ICD-10-CM | POA: Diagnosis not present

## 2022-10-12 NOTE — Progress Notes (Signed)
Synopsis: Referred in for shortness of breath by Philemon Kingdom, MD  Assessment & Plan:   1. SOB (shortness of breath)  Patient is presenting for the evaluation of exertional dyspnea in the setting of known ulcerative colitis, on Abatacept and sulfasalazine, and seronegative rheumatoid arthritis. She also carries a history of childhood asthma. She was diagnosed with pneumonia and treated with two rounds of steroids, antibiotics, and LABA/ICS inhalers.   I have reviewed her recent chest CT from May of 2024 and it does not show any signs of parenchymal lung disease or ILD. She does have a history of childhood asthma, and while FeNO in clinic during our prior visit was normal at 11 ppb, she did recently receive steroids (oral and inhaled). PFT's showed normal spirometry with only a minimally reduced DLCO. PFT's are not suggestive of asthma or other obstructive lung disease.  I will obtain a cardiopulmonary exercise test to further workup her shortness of breath. She will schedule it after her coronary CT should that be normal.  -CPET   Return in about 3 months (around 01/11/2023).  I spent 30 minutes caring for this patient today, including preparing to see the patient, obtaining a medical history , reviewing a separately obtained history, performing a medically appropriate examination and/or evaluation, counseling and educating the patient/family/caregiver, ordering medications, tests, or procedures, documenting clinical information in the electronic health record, and independently interpreting results (not separately reported/billed) and communicating results to the patient/family/caregiver  Raechel Chute, MD [facility] 10/12/2022 10:22 AM    End of visit medications:  No orders of the defined types were placed in this encounter.    Current Outpatient Medications:    albuterol (PROVENTIL) (2.5 MG/3ML) 0.083% nebulizer solution, Take 2.5 mg by nebulization  every 6 (six) hours as needed for wheezing or shortness of breath., Disp: , Rfl:    albuterol (VENTOLIN HFA) 108 (90 Base) MCG/ACT inhaler, Inhale 2 puffs into the lungs every 6 (six) hours as needed for wheezing or shortness of breath., Disp: , Rfl:    carvedilol (COREG) 6.25 MG tablet, Take 1 tablet (6.25 mg total) by mouth 2 (two) times daily., Disp: 184 tablet, Rfl: 3   certolizumab pegol (CIMZIA) 2 X 200 MG KIT, Inject 200 mg into the skin every 30 (thirty) days., Disp: , Rfl:    folic acid (FOLVITE) 1 MG tablet, Take 2 mg by mouth daily., Disp: , Rfl:    gabapentin (NEURONTIN) 300 MG capsule, Take 300 mg by mouth at bedtime as needed (sleep). Takes one tablet qhs, Disp: , Rfl:    HYDROcodone-acetaminophen (NORCO/VICODIN) 5-325 MG tablet, Take 1 tablet by mouth 2 (two) times daily as needed for moderate pain or severe pain. for pain, Disp: , Rfl:    hydrocortisone (ANUSOL-HC) 2.5 % rectal cream, Place 1 Application rectally 2 (two) times daily., Disp: , Rfl:    hydroxychloroquine (PLAQUENIL) 200 MG tablet, Take 200 mg by mouth 2 (two) times daily., Disp: , Rfl:    lansoprazole (PREVACID) 30 MG capsule, Take 30 mg by mouth 2 (two) times daily before a meal., Disp: , Rfl:    methocarbamol (ROBAXIN) 500 MG tablet, Take 500 mg by mouth in the morning and at bedtime., Disp: , Rfl:    pregabalin (LYRICA) 75 MG capsule, Take 75 mg by mouth daily., Disp: , Rfl:    spironolactone (ALDACTONE) 25 MG tablet, Take 12.5 mg by mouth 2 (two) times daily., Disp: , Rfl:    sulfaSALAzine (AZULFIDINE) 500 MG  tablet, Take 1,000 mg by mouth 2 (two) times daily., Disp: , Rfl:    traMADol (ULTRAM) 50 MG tablet, Take 50 mg by mouth 2 (two) times daily as needed for moderate pain., Disp: , Rfl:    triamcinolone cream (KENALOG) 0.1 %, Apply 1 Application topically 2 (two) times daily as needed (rash)., Disp: , Rfl:    Subjective:   PATIENT ID: Michelle Newman GENDER: female DOB: 01/23/1971, MRN:  [id number]  Chief Complaint  Patient presents with   Follow-up    Shortness of breath with exertion.     HPI  Patient is a pleasant 52 year old female with a past medical history of ulcerative colitis and rheumatoid arthritis (seronegative) presenting for follow up of shortness of breath.  She reports symptoms are unchanged since our last visit. She did try using the Air-Supra but it caused her some oral thrush and ulcers, so she discontinued it. She had her TTE that was normal, and she is awaiting a coronary CT scan.   Patient reports that she had pneumonia in May of this year which required 2 courses of antibiotics and 2 courses of prednisone with improvement.  At the time of her symptoms, she developed a cough, chest tightness (elephant on chest), and a significant wheeze. She was seen in the hospital and by her primary care physician and was prescribed antibiotics (levofloxacin) and prednisone.  Her symptoms improved but she continues to have some residual shortness of breath. The shortness of breath is mostly with exertion.   Patient has a past medical history of ulcerative colitis maintained on multiple medications in the past. She is currently on Abatacept and Sulfasalazine.  She was previously placed on leflunomide which caused her to have Bell's palsy and that was around the time that she had "pneumonia". Following all of this, her primary care physician ordered a CT scan of the chest which was performed in May. While the report is not available to me for review I was able to review the images in our PACS and I do not see any pulmonary involvement with her disease. Patient also reports a past medical history of asthma and reports having had it for a long period of time.   Patient denies smoking and does not report any occupational exposures. 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  Ancillary information including prior medications,  full medical/surgical/family/social histories, and PFTs (when available) are listed below and have been reviewed.   Review of Systems  Constitutional:  Negative for chills, fever, malaise/fatigue and weight loss.  Respiratory:  Positive for shortness of breath. Negative for cough, hemoptysis, sputum production and wheezing.   Cardiovascular:  Negative for chest pain.  Skin:  Negative for rash.     Objective:   Vitals:   10/12/22 1004  BP: 130/80  Pulse: 88  Temp: 97.6 F (36.4 C)  TempSrc: Temporal  SpO2: 100%  Weight: 183 lb 3.2 oz (83.1 kg)  Height: 5\' 4"  (1.626 m)   100% on RA  BMI Readings from Last 3 Encounters:  10/12/22 31.45 kg/m  10/08/22 30.38 kg/m  09/15/22 30.79 kg/m   Wt Readings from Last 3 Encounters:  10/12/22 183 lb 3.2 oz (83.1 kg)  10/08/22 177 lb (80.3 kg)  09/15/22 179 lb 6.4 oz (81.4 kg)    Physical Exam Constitutional:      Appearance: Normal appearance. She is obese. She is not ill-appearing.  Cardiovascular:  Rate and Rhythm: Normal rate and regular rhythm.     Pulses: Normal pulses.     Heart sounds: Normal heart sounds.  Pulmonary:     Effort: Pulmonary effort is normal. No respiratory distress.     Breath sounds: Normal breath sounds. No wheezing.  Abdominal:     Palpations: Abdomen is soft.  Neurological:     General: No focal deficit present.     Mental Status: She is alert and oriented to person, place, and time. Mental status is at baseline.       Ancillary Information    Past Medical History:  Diagnosis Date   Edema    Enteropathic arthritis    Gastroesophageal reflux disease 09/12/2020   Hx of chronic ulcerative colitis    Hypertension    Insomnia    Osteoarthritis    Other postherpetic nervous system involvement    Ovarian cyst    Peripheral edema    Rheumatoid arthritis (HCC)    Ulcerative colitis (HCC) 10/05/2016     Family History  Problem Relation Age of Onset   Non-Hodgkin's lymphoma Mother         htn   Hypertension Mother    CAD Father    Hypertension Father    Heart attack Father      Past Surgical History:  Procedure Laterality Date   CESAREAN SECTION     x2   CHOLECYSTECTOMY     ex lap for ruptured ovarian cyst Left    hysterectomy with ovaries remaining     LUMBAR FUSION     L5-S1   Neck fusion     C5-6-7   Torn meniscus repair Right     Social History   Socioeconomic History   Marital status: Married    Spouse name: Not on file   Number of children: 2   Years of education: 14   Highest education level: Not on file  Occupational History   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 [employer]    Occupation: Charity fundraiser  Tobacco Use   Smoking status: Never   Smokeless tobacco: Never  Vaping Use   Vaping status: Never Used  Substance and Sexual Activity   Alcohol use: Never   Drug use: Never   Sexual activity: Yes  Other Topics Concern   Not on file  Social History Narrative   Married 2 sons and one girl   One story   Right handed    Social Determinants of Health   Financial Resource Strain: Not on file  Food Insecurity: Not on file  Transportation Needs: Not on file  Physical Activity: Not on file  Stress: Not on file  Social Connections: Not on file  Intimate Partner Violence: Not on file     Allergies  Allergen Reactions   Sulfamethoxazole-Trimethoprim Other (See Comments)   Amoxicillin-Pot Clavulanate Nausea And Vomiting    GI bleeding noted within 24 hours of taking   Flexeril [Cyclobenzaprine]     Unable to urinate    Infliximab     Other Reaction(s): Heart Palps   Metoprolol Succinate [Metoprolol Tartrate]     Flushing    Nsaids Other (See Comments)    Ileal ulcers   Tocilizumab     Other Reaction(s): Mouth ulcers     CBC No results found for: "WBC", "RBC", "HGB", "HCT", "PLT", "MCV", "MCH", "MCHC", "RDW", "LYMPHSABS", "MONOABS", "EOSABS", "BASOSABS"  Pulmonary Functions Testing Results:    Latest Ref Rng & Units 09/29/2022    7:46 AM  PFT  Results  FVC-Pre L 3.44   FVC-Predicted Pre % 97   FVC-Post L 3.36   FVC-Predicted Post % 94   Pre FEV1/FVC % % 86   Post FEV1/FCV % % 87   FEV1-Pre L 2.97   FEV1-Predicted Pre % 105   FEV1-Post L 2.91   DLCO uncorrected ml/min/mmHg 15.36   DLCO UNC% % 72   DLVA Predicted % 77   TLC L 5.44   TLC % Predicted % 107   RV % Predicted % 118     Outpatient Medications Prior to Visit  Medication Sig Dispense Refill   albuterol (PROVENTIL) (2.5 MG/3ML) 0.083% nebulizer solution Take 2.5 mg by nebulization every 6 (six) hours as needed for wheezing or shortness of breath.     albuterol (VENTOLIN HFA) 108 (90 Base) MCG/ACT inhaler Inhale 2 puffs into the lungs every 6 (six) hours as needed for wheezing or shortness of breath.     carvedilol (COREG) 6.25 MG tablet Take 1 tablet (6.25 mg total) by mouth 2 (two) times daily. 184 tablet 3   certolizumab pegol (CIMZIA) 2 X 200 MG KIT Inject 200 mg into the skin every 30 (thirty) days.     folic acid (FOLVITE) 1 MG tablet Take 2 mg by mouth daily.     gabapentin (NEURONTIN) 300 MG capsule Take 300 mg by mouth at bedtime as needed (sleep). Takes one tablet qhs     HYDROcodone-acetaminophen (NORCO/VICODIN) 5-325 MG tablet Take 1 tablet by mouth 2 (two) times daily as needed for moderate pain or severe pain. for pain     hydrocortisone (ANUSOL-HC) 2.5 % rectal cream Place 1 Application rectally 2 (two) times daily.     hydroxychloroquine (PLAQUENIL) 200 MG tablet Take 200 mg by mouth 2 (two) times daily.     lansoprazole (PREVACID) 30 MG capsule Take 30 mg by mouth 2 (two) times daily before a meal.     methocarbamol (ROBAXIN) 500 MG tablet Take 500 mg by mouth in the morning and at bedtime.     pregabalin (LYRICA) 75 MG capsule Take 75 mg by mouth daily.     spironolactone (ALDACTONE) 25 MG tablet Take 12.5 mg by mouth 2 (two) times daily.     sulfaSALAzine (AZULFIDINE) 500 MG tablet Take 1,000 mg by mouth 2 (two) times daily.     traMADol (ULTRAM)  50 MG tablet Take 50 mg by mouth 2 (two) times daily as needed for moderate pain.     triamcinolone cream (KENALOG) 0.1 % Apply 1 Application topically 2 (two) times daily as needed (rash).     No facility-administered medications prior to visit.

## 2022-10-15 ENCOUNTER — Telehealth: Payer: Self-pay

## 2022-10-15 ENCOUNTER — Telehealth (HOSPITAL_COMMUNITY): Payer: Self-pay | Admitting: *Deleted

## 2022-10-15 NOTE — Telephone Encounter (Signed)
**Note De-Identified Arav Bannister Obfuscation** Ordering provider: Dr Bing Matter Associated diagnoses: R06.83-Snoring  WatchPAT PA obtained on 10/15/2022 by Dayle Mcnerney, Lorelle Formosa, LPN. Authorization: Yes; tracking ID Decision ID #: [id number] Patient will be provided a WatchPAT One-HST device and the PIN (1234) by Dr Vanetta Shawl nurse once I forward this note to her letting her know PA is not required.  Instructions for covering staff:  Please contact patient in 2 weeks if WatchPAT study results are not available yet. Remind patient to complete test.  If patient declines to proceed with test, please confirm that box is unopened and remind patient to return it to the office within 30 days. Route phone note to [facility] DIV SLEEP STUDIES pool for tracking.  If box has been opened, please route phone note to Erskine Squibb (billing department).

## 2022-10-15 NOTE — Telephone Encounter (Signed)
Reaching out to patient to offer assistance regarding upcoming cardiac imaging study; pt verbalizes understanding of appt date/time, parking situation and where to check in, pre-test NPO status and medications ordered, and verified current allergies; name and call back number provided for further questions should they arise Johney Frame RN Navigator Cardiac Imaging [facility] [phone] office [phone] cell

## 2022-10-15 NOTE — Telephone Encounter (Signed)
Patient agreement reviewed and signed on 10/15/2022.  WatchPAT issued to patient on 10/15/2022 by Neena Rhymes, RN. Patient aware to not open the WatchPAT box until contacted with the activation PIN. Pt has Device and PIN Patient profile initialized in Cloud[employer] on 10/15/2022 by Baldo Ash, RN. Device serial number: [id number]  Please list Reason for Call as Advice Only and type "WatchPAT issued to patient" in the comment box.

## 2022-10-16 ENCOUNTER — Encounter (HOSPITAL_COMMUNITY): Payer: Self-pay

## 2022-10-16 ENCOUNTER — Ambulatory Visit (HOSPITAL_COMMUNITY)
Admission: RE | Admit: 2022-10-16 | Discharge: 2022-10-16 | Disposition: A | Discharge status: Home / Self Care | Payer: 59 | Source: Ambulatory Visit | Attending: Cardiology | Admitting: Cardiology

## 2022-10-16 DIAGNOSIS — R072 Precordial pain: Secondary | ICD-10-CM | POA: Diagnosis present

## 2022-10-16 MED ORDER — DILTIAZEM HCL 25 MG/5ML IV SOLN
INTRAVENOUS | Status: AC
  Filled 2022-10-16: qty 5

## 2022-10-16 MED ORDER — METOPROLOL TARTRATE 5 MG/5ML IV SOLN
5.0000 mg | Freq: Once | INTRAVENOUS | Status: AC
  Administered 2022-10-16: 5 mg via INTRAVENOUS

## 2022-10-16 MED ORDER — DILTIAZEM HCL 25 MG/5ML IV SOLN
10.0000 mg | Freq: Once | INTRAVENOUS | Status: AC
  Administered 2022-10-16: 10 mg via INTRAVENOUS

## 2022-10-16 MED ORDER — NITROGLYCERIN 0.4 MG SL SUBL
SUBLINGUAL_TABLET | SUBLINGUAL | Status: AC
  Filled 2022-10-16: qty 2

## 2022-10-16 MED ORDER — IOHEXOL 350 MG/ML SOLN
95.0000 mL | Freq: Once | INTRAVENOUS | Status: AC | PRN
  Administered 2022-10-16: 95 mL via INTRAVENOUS

## 2022-10-16 MED ORDER — METOPROLOL TARTRATE 5 MG/5ML IV SOLN
INTRAVENOUS | Status: AC
  Filled 2022-10-16: qty 5

## 2022-10-16 MED ORDER — NITROGLYCERIN 0.4 MG SL SUBL
0.8000 mg | SUBLINGUAL_TABLET | Freq: Once | SUBLINGUAL | Status: AC
  Administered 2022-10-16: 0.8 mg via SUBLINGUAL

## 2022-10-26 ENCOUNTER — Telehealth: Payer: Self-pay

## 2022-10-26 NOTE — Telephone Encounter (Signed)
Left message on My Chart with normal Echo results per Dr. Krasowski's note. Routed to PCP.  

## 2022-10-26 NOTE — Telephone Encounter (Signed)
Left message on My Chart with normal CT Angio results per Dr. Vanetta Shawl note. Routed to PCP.

## 2022-10-27 ENCOUNTER — Ambulatory Visit (HOSPITAL_COMMUNITY): Payer: 59 | Attending: *Deleted

## 2022-10-27 DIAGNOSIS — R0602 Shortness of breath: Secondary | ICD-10-CM

## 2022-10-31 ENCOUNTER — Encounter (INDEPENDENT_AMBULATORY_CARE_PROVIDER_SITE_OTHER): Payer: 59 | Admitting: Cardiology

## 2022-10-31 DIAGNOSIS — R0683 Snoring: Secondary | ICD-10-CM | POA: Diagnosis not present

## 2022-11-01 ENCOUNTER — Ambulatory Visit: Payer: 59 | Attending: Cardiology

## 2022-11-01 DIAGNOSIS — R0683 Snoring: Secondary | ICD-10-CM

## 2022-11-01 NOTE — Procedures (Signed)
   Patient Information Study Date: 10/31/2022 Patient Name: Michelle Newman Patient ID: [id number] Birth Date: [birth year] Age: 52 Gender: Female BMI: 30.5 (W=178 lb, H=5' 4'') Referring Physician: Gypsy Balsam, MD  TEST DESCRIPTION: Home sleep apnea testing was completed using the WatchPat, a Type 1 device, utilizing peripheral arterial tonometry (PAT), chest movement, actigraphy, pulse oximetry, pulse rate, body position and snore. AHI was calculated with apnea and hypopnea using valid sleep time as the denominator. RDI includes apneas, hypopneas, and RERAs. The data acquired and the scoring of sleep and all associated events were performed in accordance with the recommended standards and specifications as outlined in the AASM Manual for the Scoring of Sleep and Associated Events 2.2.0 (2015).   FINDINGS: 1.  No evidence of Obstructive Sleep Apnea with AHI 3.4/hr.  2.  No Central Sleep Apnea. 3.  Oxygen desaturations as low as 88%. 4.  Mild snoring was present. O2 sats were < 88% for 0.2 minutes. 5.  Total sleep time was 5 hrs and 56 min. 6.  29.9% of total sleep time was spent in REM sleep.  7.  Shortened sleep onset latency at 6 min.  8.  Shortened REM sleep onset latency at 67 min.  9.  Total awakenings were 5.   DIAGNOSIS:  Normal study with no significant sleep disordered breathing.  RECOMMENDATIONS:   1. Normal study with no significant sleep disordered breathing.  2.  Healthy sleep recommendations include:  adequate nightly sleep (normal 7-9 hrs/night), avoidance of caffeine after noon and alcohol near bedtime, and maintaining a sleep environment that is cool, dark and quiet.  3.  Weight loss for overweight patients is recommended.    4.  Snoring recommendations include:  weight loss where appropriate, side sleeping, and avoidance of alcohol before bed.  5.  Operation of motor vehicle or dangerous equipment must be avoided when feeling drowsy, excessively sleepy, or  mentally fatigued.    6.  An ENT consultation which may be useful for specific causes of and possible treatment of bothersome snoring.   7. Weight loss may be of benefit in reducing the severity of snoring.   Signature: Armanda Magic, MD; [facility]; Diplomat, American Board of Sleep Medicine Electronically Signed: 11/01/2022 6:54:33 PM

## 2022-11-02 NOTE — Telephone Encounter (Signed)
Dr. Dgayli, please advise. Thanks   

## 2022-11-06 ENCOUNTER — Telehealth: Payer: Self-pay

## 2022-11-06 NOTE — Telephone Encounter (Signed)
-----   Message from [place] sent at 11/05/2022  7:50 PM EDT ----- Overall very good report the score or total atherosclerosis is 0.  There is a minimal degree of plaque not calcified and one-vessel not affecting blood flow.  I would describe this is quite close to normal.

## 2022-11-06 NOTE — Telephone Encounter (Signed)
Patient notified of results and verbalized understanding.  

## 2022-11-13 ENCOUNTER — Telehealth: Payer: Self-pay

## 2022-11-13 NOTE — Telephone Encounter (Signed)
-----   Message from Armanda Magic sent at 11/01/2022  6:56 PM EDT ----- Please let patient know that sleep study showed no significant sleep apnea.

## 2022-11-13 NOTE — Telephone Encounter (Signed)
Notified patient of sleep study results and recommendations. All questions were answered and patient verbalized understanding.

## 2022-11-16 ENCOUNTER — Ambulatory Visit: Payer: 59 | Attending: Cardiology | Admitting: Cardiology

## 2022-11-16 ENCOUNTER — Encounter: Payer: Self-pay | Admitting: Cardiology

## 2022-11-16 VITALS — BP 130/80 | HR 89 | Ht 64.0 in | Wt 183.4 lb

## 2022-11-16 DIAGNOSIS — M0600 Rheumatoid arthritis without rheumatoid factor, unspecified site: Secondary | ICD-10-CM

## 2022-11-16 DIAGNOSIS — I3139 Other pericardial effusion (noninflammatory): Secondary | ICD-10-CM | POA: Diagnosis not present

## 2022-11-16 DIAGNOSIS — R0609 Other forms of dyspnea: Secondary | ICD-10-CM

## 2022-11-16 DIAGNOSIS — R0789 Other chest pain: Secondary | ICD-10-CM

## 2022-11-16 MED ORDER — FUROSEMIDE 40 MG PO TABS: 40.0000 mg | ORAL_TABLET | Freq: Every day | ORAL | 3 refills | Status: DC

## 2022-11-16 NOTE — Addendum Note (Signed)
Addended by: Baldo Ash D on: 11/16/2022 03:26 PM   Modules accepted: Orders

## 2022-11-16 NOTE — Progress Notes (Signed)
Cardiology Office Note:    Date:  11/16/2022   ID:  Michelle Newman, DOB 05/15/1970, MRN [id number]  PCP:  Philemon Kingdom, MD  Cardiologist:  Gypsy Balsam, MD    Referring MD: Philemon Kingdom, MD   Chief Complaint  Patient presents with   Follow-up    History of Present Illness:    Michelle Newman is a 52 y.o. female who presented to [place] with constellation of a lot of symptoms including some chest pain as well as shortness of breath swelling of lower extremities.  Workup so far has been negative.  Echocardiogram showed preserved left ventricle ejection fraction, she did have coronary CT angio which showed only minimal disease between 0 and 25%.  She also had sleep study done which was negative.  Zio patch that she wears demonstrates some APCs.  We put her on carvedilol seem to be helping quite nicely and she feels much better right now.  Past Medical History:  Diagnosis Date   Edema    Enteropathic arthritis    Gastroesophageal reflux disease 09/12/2020   Hx of chronic ulcerative colitis    Hypertension    Insomnia    Osteoarthritis    Other postherpetic nervous system involvement    Ovarian cyst    Peripheral edema    Rheumatoid arthritis (HCC)    Ulcerative colitis (HCC) 10/05/2016    Past Surgical History:  Procedure Laterality Date   CESAREAN SECTION     x2   CHOLECYSTECTOMY     ex lap for ruptured ovarian cyst Left    hysterectomy with ovaries remaining     LUMBAR FUSION     L5-S1   Neck fusion     C5-6-7   Torn meniscus repair Right     Current Medications: Current Meds  Medication Sig   albuterol (PROVENTIL) (2.5 MG/3ML) 0.083% nebulizer solution Take 2.5 mg by nebulization every 6 (six) hours as needed for wheezing or shortness of breath.   albuterol (VENTOLIN HFA) 108 (90 Base) MCG/ACT inhaler Inhale 2 puffs into the lungs every 6 (six) hours as needed for wheezing or shortness of breath.   carvedilol (COREG) 6.25 MG tablet Take  1 tablet (6.25 mg total) by mouth 2 (two) times daily.   certolizumab pegol (CIMZIA) 2 X 200 MG KIT Inject 200 mg into the skin every 30 (thirty) days.   folic acid (FOLVITE) 1 MG tablet Take 2 mg by mouth daily.   gabapentin (NEURONTIN) 300 MG capsule Take 300 mg by mouth at bedtime as needed (sleep). Takes one tablet qhs   HYDROcodone-acetaminophen (NORCO/VICODIN) 5-325 MG tablet Take 1 tablet by mouth 2 (two) times daily as needed for moderate pain or severe pain. for pain   hydrocortisone (ANUSOL-HC) 2.5 % rectal cream Place 1 Application rectally 2 (two) times daily.   hydroxychloroquine (PLAQUENIL) 200 MG tablet Take 200 mg by mouth 2 (two) times daily.   lansoprazole (PREVACID) 30 MG capsule Take 30 mg by mouth 2 (two) times daily before a meal.   methocarbamol (ROBAXIN) 500 MG tablet Take 500 mg by mouth in the morning and at bedtime.   pregabalin (LYRICA) 75 MG capsule Take 75 mg by mouth daily.   spironolactone (ALDACTONE) 25 MG tablet Take 12.5 mg by mouth 2 (two) times daily.   sulfaSALAzine (AZULFIDINE) 500 MG tablet Take 1,000 mg by mouth 2 (two) times daily.   traMADol (ULTRAM) 50 MG tablet Take 50 mg by mouth 2 (two) times daily as needed for moderate  pain.   triamcinolone cream (KENALOG) 0.1 % Apply 1 Application topically 2 (two) times daily as needed (rash).     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 [employer]    Occupation: Charity fundraiser  Tobacco Use   Smoking status: Never   Smokeless tobacco: Never  Vaping Use   Vaping status: Never Used  Substance and Sexual Activity   Alcohol use: Never   Drug  use: Never   Sexual activity: Yes  Other Topics Concern   Not on file  Social History Narrative   Married 2 sons and one girl   One story   Right handed    Social Determinants of Health   Financial Resource Strain: Not on file  Food Insecurity: Not on file  Transportation Needs: Not on file  Physical Activity: Not on file  Stress: Not on file  Social Connections: Not on file     Family History: The patient's family history includes CAD in her father; Heart attack in her father; Hypertension in her father and mother; Non-Hodgkin's lymphoma in her mother. ROS:   Please see the history of present illness.    All 14 point review of systems negative except as described per history of present illness  EKGs/Labs/Other Studies Reviewed:         Recent Labs: 10/08/2022: BUN 17; Creatinine, Ser 0.89; Potassium 4.4; Sodium 141  Recent Lipid Panel No results found for: "CHOL", "TRIG", "HDL", "CHOLHDL", "VLDL", "LDLCALC", "LDLDIRECT"  Physical Exam:    VS:  BP 130/80 (BP Location: Left Arm, Patient Position: Sitting)   Pulse 89   Ht 5\' 4"  (1.626 m)   Wt 183 lb 6.4 oz (83.2 kg)   SpO2 94%   BMI 31.48 kg/m     Wt Readings from Last 3 Encounters:  11/16/22 183 lb 6.4 oz (83.2 kg)  10/12/22 183 lb 3.2 oz (83.1 kg)  10/08/22 177 lb (80.3 kg)     GEN:  Well nourished, well developed in no acute distress HEENT: Normal NECK: No JVD; No carotid bruits LYMPHATICS: No lymphadenopathy CARDIAC: RRR, no murmurs, no rubs, no gallops RESPIRATORY:  Clear to auscultation without rales, wheezing or rhonchi  ABDOMEN: Soft, non-tender, non-distended MUSCULOSKELETAL:  No edema; No deformity  SKIN: Warm and dry LOWER EXTREMITIES: no swelling NEUROLOGIC:  Alert and oriented x 3 PSYCHIATRIC:  Normal affect   ASSESSMENT:    1. Atypical chest pain   2. Dyspnea on exertion   3. Rheumatoid arthritis with negative rheumatoid factor, involving unspecified site (HCC)   4. Pericardial effusion     PLAN:    In order of problems listed above:  Palpitations.  This is successfully suppressed with small dose of beta-blocker which I will continue. Atypical chest pain coronary CT angio showed only minimal disease continue monitoring. Swelling of lower extremities which is minimal.  No cardiac explanation for this I suspect this is related to rheumatoid. Pericardial fusion minimal asymptomatic. Rheumatoid followed by rheumatologist Swelling I asked her to start taking 40 mg of Lasix every day she takes 80 mg 3-4 times a week I think it would be safer for her to take steady dose of Lasix  rather than such pulse treatment and I told her she will not need to take it if she does not have any swelling we will check her Chem-7 1 next week   Medication Adjustments/Labs and Tests Ordered: Current medicines are reviewed at length with the patient today.  Concerns regarding medicines are outlined above.  No orders of the defined types were placed in this encounter.  Medication changes: No orders of the defined types were placed in this encounter.   Signed, Georgeanna Lea, MD, [facility] [address] 11/16/2022 3:15 PM    [facility]

## 2022-11-16 NOTE — Patient Instructions (Signed)
Medication Instructions:   START: Lasix 40mg  1 tablet daily   Lab Work: Your physician recommends that you return for lab work in: 1 week You need to have labs done when you are fasting.  You can come Monday through Friday 8:30 am to 12:00 pm and 1:15 to 4:30. You do not need to make an appointment as the order has already been placed. The labs you are going to have done are BMET   Testing/Procedures: None Ordered   Follow-Up: At [facility], you and your health needs are our priority.  As part of our continuing mission to provide you with exceptional heart care, we have created designated Provider Care Teams.  These Care Teams include your primary Cardiologist (physician) and Advanced Practice Providers (APPs -  Physician Assistants and Nurse Practitioners) who all work together to provide you with the care you need, when you need it.  We recommend signing up for the patient portal called "MyChart".  Sign up information is provided on this After Visit Summary.  MyChart is used to connect with patients for Virtual Visits (Telemedicine).  Patients are able to view lab/test results, encounter notes, upcoming appointments, etc.  Non-urgent messages can be sent to your provider as well.   To learn more about what you can do with MyChart, go to [email or web address].    Your next appointment:   6 month(s)  The format for your next appointment:   In Person  Provider:   Gypsy Balsam, MD    Other Instructions NA

## 2023-01-11 ENCOUNTER — Ambulatory Visit: Payer: 59 | Admitting: Student in an Organized Health Care Education/Training Program

## 2023-01-14 ENCOUNTER — Ambulatory Visit: Payer: 59 | Admitting: Cardiology

## 2023-05-25 ENCOUNTER — Other Ambulatory Visit: Payer: Self-pay | Admitting: Orthopedic Surgery

## 2023-05-25 DIAGNOSIS — M4326 Fusion of spine, lumbar region: Secondary | ICD-10-CM

## 2023-05-28 ENCOUNTER — Ambulatory Visit
Admission: RE | Admit: 2023-05-28 | Discharge: 2023-05-28 | Disposition: A | Discharge status: Home / Self Care | Source: Ambulatory Visit | Attending: Orthopedic Surgery | Admitting: Orthopedic Surgery

## 2023-05-28 DIAGNOSIS — M4326 Fusion of spine, lumbar region: Secondary | ICD-10-CM

## 2023-06-02 ENCOUNTER — Ambulatory Visit: Attending: Cardiology | Admitting: Cardiology

## 2023-06-02 ENCOUNTER — Encounter: Payer: Self-pay | Admitting: Cardiology

## 2023-06-02 VITALS — BP 140/100 | HR 103 | Ht 63.0 in | Wt 166.4 lb

## 2023-06-02 DIAGNOSIS — K51919 Ulcerative colitis, unspecified with unspecified complications: Secondary | ICD-10-CM

## 2023-06-02 DIAGNOSIS — R0789 Other chest pain: Secondary | ICD-10-CM | POA: Diagnosis not present

## 2023-06-02 DIAGNOSIS — R002 Palpitations: Secondary | ICD-10-CM | POA: Diagnosis not present

## 2023-06-02 DIAGNOSIS — M0600 Rheumatoid arthritis without rheumatoid factor, unspecified site: Secondary | ICD-10-CM

## 2023-06-02 DIAGNOSIS — E785 Hyperlipidemia, unspecified: Secondary | ICD-10-CM | POA: Insufficient documentation

## 2023-06-02 DIAGNOSIS — R0609 Other forms of dyspnea: Secondary | ICD-10-CM

## 2023-06-02 MED ORDER — CARVEDILOL 12.5 MG PO TABS: 12.5000 mg | ORAL_TABLET | Freq: Two times a day (BID) | ORAL | 3 refills | Status: DC

## 2023-06-02 NOTE — Progress Notes (Signed)
 Cardiology Office Note:    Date:  06/02/2023   ID:  Michelle Newman, DOB Apr 27, 1970, MRN [id number]  PCP:  Olan Bering, MD  Cardiologist:  Ralene Burger, MD    Referring MD: Olan Bering, MD   Chief Complaint  Patient presents with   Palpitations    History of Present Illness:    Michelle Newman is a 53 y.o. female past medical history significant for atypical chest pain, shortness of breath swelling of lower extremities so far cardiac workup negative echocardiogram showed preserved ejection fraction, CT angio showed only minimal disease, she did wear Zio patch which showed some APCs and PVCs.  Comes today to my office to complain of palpitations getting worse also she would like to have some genetic testing done apparently because daughter was fine to have mosaic Turner syndrome she would like to be tested for it.  Denies have any chest pain tightness squeezing pressure burning chest, rheumatoid arthritis is acting up but relatively stable  Past Medical History:  Diagnosis Date   Edema    Enteropathic arthritis    Gastroesophageal reflux disease 09/12/2020   Hx of chronic ulcerative colitis    Hypertension    Insomnia    Osteoarthritis    Other postherpetic nervous system involvement    Ovarian cyst    Peripheral edema    Rheumatoid arthritis (HCC)    Ulcerative colitis (HCC) 10/05/2016    Past Surgical History:  Procedure Laterality Date   CESAREAN SECTION     x2   CHOLECYSTECTOMY     ex lap for ruptured ovarian cyst Left    hysterectomy with ovaries remaining     LUMBAR FUSION     L5-S1   Neck fusion     C5-6-7   Torn meniscus repair Right     Current Medications: Current Meds  Medication Sig   albuterol  (PROVENTIL ) (2.5 MG/3ML) 0.083% nebulizer solution Take 2.5 mg by nebulization every 6 (six) hours as needed for wheezing or shortness of breath.   albuterol  (VENTOLIN  HFA) 108 (90 Base) MCG/ACT inhaler Inhale 2 puffs into the lungs  every 6 (six) hours as needed for wheezing or shortness of breath.   carvedilol  (COREG ) 6.25 MG tablet Take 1 tablet (6.25 mg total) by mouth 2 (two) times daily.   certolizumab pegol (CIMZIA) 2 X 200 MG KIT Inject 200 mg into the skin every 30 (thirty) days.   folic acid (FOLVITE) 1 MG tablet Take 2 mg by mouth daily.   furosemide  (LASIX ) 40 MG tablet Take 1 tablet (40 mg total) by mouth daily.   gabapentin (NEURONTIN) 300 MG capsule Take 300 mg by mouth at bedtime as needed (sleep). Takes one tablet qhs   HYDROcodone-acetaminophen (NORCO/VICODIN) 5-325 MG tablet Take 1 tablet by mouth 2 (two) times daily as needed for moderate pain or severe pain. for pain   hydrocortisone (ANUSOL-HC) 2.5 % rectal cream Place 1 Application rectally 2 (two) times daily.   hydroxychloroquine (PLAQUENIL) 200 MG tablet Take 200 mg by mouth 2 (two) times daily.   lansoprazole (PREVACID) 30 MG capsule Take 30 mg by mouth 2 (two) times daily before a meal.   methocarbamol (ROBAXIN) 500 MG tablet Take 500 mg by mouth in the morning and at bedtime.   pregabalin (LYRICA) 75 MG capsule Take 75 mg by mouth daily.   spironolactone (ALDACTONE) 25 MG tablet Take 12.5 mg by mouth 2 (two) times daily.   sulfaSALAzine (AZULFIDINE) 500 MG tablet Take 1,000 mg by mouth  2 (two) times daily.   tirzepatide (ZEPBOUND) 7.5 MG/0.5ML Pen Inject 7.5 mg into the skin once a week.   topiramate (TOPAMAX) 50 MG tablet Take 50 mg by mouth at bedtime.   traMADol (ULTRAM) 50 MG tablet Take 50 mg by mouth 2 (two) times daily as needed for moderate pain.   triamcinolone  cream (KENALOG ) 0.1 % Apply 1 Application topically 2 (two) times daily as needed (rash).     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 [employer]    Occupation: Charity fundraiser  Tobacco Use   Smoking status: Never   Smokeless tobacco: Never  Vaping Use   Vaping status: Never Used  Substance and Sexual Activity   Alcohol use: Never   Drug use: Never   Sexual activity: Yes  Other Topics Concern   Not on file  Social History Narrative   Married 2 sons and one girl   One story   Right handed    Social Drivers of Health   Financial Resource Strain: Not on file  Food Insecurity: Low Risk  (12/10/2022)   Received from [facility]   Hunger Vital Sign    Worried About Running Out of Food in the Last Year: Never true    Ran Out of Food in the Last Year: Never true  Transportation Needs: No Transportation Needs (12/10/2022)   Received from [facility]    In the past 12 months, has lack of reliable transportation kept you from medical appointments, meetings, work or from getting things needed for daily living? : No  Physical Activity: Not on file  Stress: Not on file  Social Connections: Not on file     Family History: The patient's family history includes CAD in her father; Heart attack in her father; Hypertension in her father and mother; Non-Hodgkin's lymphoma in her mother. ROS:   Please see the history of present illness.    All 14 point review of systems negative except as described per history of present illness  EKGs/Labs/Other Studies Reviewed:    EKG Interpretation Date/Time:  Wednesday Jun 02 2023 08:15:42 EDT Ventricular Rate:  98 PR Interval:  160 QRS Duration:  74 QT Interval:  348 QTC Calculation: 444 R Axis:   5  Text Interpretation: Sinus rhythm with frequent Premature ventricular complexes Possible Left atrial enlargement Low voltage QRS Borderline ECG When compared with ECG of 15-Sep-2022 13:49, No significant change was found Confirmed by Ralene Burger [phone]) on  06/02/2023 8:17:29 AM    Recent Labs: 10/08/2022: BUN 17; Creatinine, Ser 0.89; Potassium 4.4; Sodium 141  Recent Lipid Panel No results found for: "CHOL", "TRIG", "HDL", "CHOLHDL", "VLDL", "LDLCALC", "LDLDIRECT"  Physical Exam:    VS:  BP (!) 140/100 (BP Location: Right Arm, Patient Position: Sitting)   Pulse (!) 103   Ht 5\' 3"  (1.6 m)   Wt 166 lb 6.4 oz (75.5 kg)   SpO2 96%   BMI 29.48 kg/m     Wt Readings from Last 3 Encounters:  06/02/23 166 lb 6.4 oz (75.5 kg)  11/16/22 183 lb 6.4 oz (83.2 kg)  10/12/22 183 lb 3.2 oz (83.1 kg)  GEN:  Well nourished, well developed in no acute distress HEENT: Normal NECK: No JVD; No carotid bruits LYMPHATICS: No lymphadenopathy CARDIAC: RRR, no murmurs, no rubs, no gallops RESPIRATORY:  Clear to auscultation without rales, wheezing or rhonchi  ABDOMEN: Soft, non-tender, non-distended MUSCULOSKELETAL:  No edema; No deformity  SKIN: Warm and dry LOWER EXTREMITIES: no swelling NEUROLOGIC:  Alert and oriented x 3 PSYCHIATRIC:  Normal affect   ASSESSMENT:    1. Palpitations   2. Ulcerative colitis with complication, unspecified location (HCC)   3. Rheumatoid arthritis with negative rheumatoid factor, involving unspecified site (HCC)   4. Atypical chest pain   5. Dyspnea on exertion   6. Dyslipidemia    PLAN:    In order of problems listed above:  Palpitations we had a long discussion about that.  Will increase dose of carvedilol  from 6.25 twice daily to 12.5 twice daily. Ulcerative colitis that being followed by rheumatology. Rheumatoid arthritis acting up a little bit she said every single medication she tried work for few years and then stopped working. Atypical chest pain denies having any. Dyspnea on exertion and so far workup negative. Dyslipidemia did review her blood test however I have last cholesterol from 2021 with LDL 147 with her minimal disease would be beneficial to be on statin.  Will get another fasting lipid  profile   Medication Adjustments/Labs and Tests Ordered: Current medicines are reviewed at length with the patient today.  Concerns regarding medicines are outlined above.  Orders Placed This Encounter  Procedures   EKG 12-Lead   Medication changes: No orders of the defined types were placed in this encounter.   Signed, Manfred Seed, MD, [facility] 06/02/2023 8:36 AM    [facility]

## 2023-06-02 NOTE — Addendum Note (Signed)
 Addended by: Shawnee Dellen D on: 06/02/2023 08:44 AM   Modules accepted: Orders

## 2023-06-02 NOTE — Patient Instructions (Addendum)
 Medication Instructions:   INCREASE: Carvedilol  to 12.5mg  1 tablet twice daily   Lab Work: Your physician recommends that you return for lab work in: when fasting You need to have labs done when you are fasting.  You can come Monday through Friday 8:30 am to 12:00 pm and 1:15 to 4:30. You do not need to make an appointment as the order has already been placed. The labs you are going to have done are AST, ALT Lipids.    Testing/Procedures: None Ordered   Follow-Up: At [facility], you and your health needs are our priority.  As part of our continuing mission to provide you with exceptional heart care, we have created designated Provider Care Teams.  These Care Teams include your primary Cardiologist (physician) and Advanced Practice Providers (APPs -  Physician Assistants and Nurse Practitioners) who all work together to provide you with the care you need, when you need it.  We recommend signing up for the patient portal called "MyChart".  Sign up information is provided on this After Visit Summary.  MyChart is used to connect with patients for Virtual Visits (Telemedicine).  Patients are able to view lab/test results, encounter notes, upcoming appointments, etc.  Non-urgent messages can be sent to your provider as well.   To learn more about what you can do with MyChart, go to [email or web address].    Your next appointment:   6 month(s)  The format for your next appointment:   In Person  Provider:   Ralene Burger, MD    Other Instructions NA

## 2023-06-15 ENCOUNTER — Telehealth: Payer: Self-pay

## 2023-06-15 ENCOUNTER — Encounter: Payer: Self-pay | Admitting: Cardiology

## 2023-06-15 DIAGNOSIS — R0609 Other forms of dyspnea: Secondary | ICD-10-CM

## 2023-06-15 DIAGNOSIS — R002 Palpitations: Secondary | ICD-10-CM

## 2023-06-15 DIAGNOSIS — R0789 Other chest pain: Secondary | ICD-10-CM

## 2023-06-15 NOTE — Telephone Encounter (Signed)
Labs ordered per Dr. Vanetta Shawl note

## 2023-06-16 DIAGNOSIS — Z981 Arthrodesis status: Secondary | ICD-10-CM | POA: Insufficient documentation

## 2023-06-23 ENCOUNTER — Encounter: Payer: Self-pay | Admitting: Cardiology

## 2023-06-27 DIAGNOSIS — R7989 Other specified abnormal findings of blood chemistry: Secondary | ICD-10-CM | POA: Insufficient documentation

## 2023-06-27 DIAGNOSIS — R609 Edema, unspecified: Secondary | ICD-10-CM | POA: Insufficient documentation

## 2023-06-27 DIAGNOSIS — J45909 Unspecified asthma, uncomplicated: Secondary | ICD-10-CM | POA: Insufficient documentation

## 2023-06-27 DIAGNOSIS — E785 Hyperlipidemia, unspecified: Secondary | ICD-10-CM | POA: Insufficient documentation

## 2023-06-27 DIAGNOSIS — D520 Dietary folate deficiency anemia: Secondary | ICD-10-CM | POA: Insufficient documentation

## 2023-06-27 DIAGNOSIS — M2550 Pain in unspecified joint: Secondary | ICD-10-CM | POA: Insufficient documentation

## 2023-06-27 DIAGNOSIS — I10 Essential (primary) hypertension: Secondary | ICD-10-CM | POA: Insufficient documentation

## 2023-06-27 DIAGNOSIS — M79641 Pain in right hand: Secondary | ICD-10-CM | POA: Insufficient documentation

## 2023-07-03 LAB — BASIC METABOLIC PANEL WITH GFR
BUN/Creatinine Ratio: 21 (ref 9–23)
BUN: 20 mg/dL (ref 6–24)
CO2: 22 mmol/L (ref 20–29)
Calcium: 9.1 mg/dL (ref 8.7–10.2)
Chloride: 103 mmol/L (ref 96–106)
Creatinine, Ser: 0.96 mg/dL (ref 0.57–1.00)
Glucose: 74 mg/dL (ref 70–99)
Potassium: 4.3 mmol/L (ref 3.5–5.2)
Sodium: 139 mmol/L (ref 134–144)
eGFR: 72 mL/min/{1.73_m2} (ref >59)

## 2023-07-03 LAB — MAGNESIUM: Magnesium: 2.2 mg/dL (ref 1.6–2.3)

## 2023-07-03 LAB — TSH: TSH: 1.99 u[IU]/mL (ref 0.450–4.500)

## 2023-07-12 ENCOUNTER — Ambulatory Visit: Payer: Self-pay | Admitting: Cardiology

## 2023-07-13 ENCOUNTER — Telehealth: Payer: Self-pay

## 2023-07-13 NOTE — Telephone Encounter (Signed)
 Left message on My Chart with lab results per Dr. Vanetta Shawl note. Routed to PCP.

## 2023-07-14 ENCOUNTER — Telehealth: Payer: Self-pay

## 2023-07-14 NOTE — Telephone Encounter (Signed)
 Pt viewed  Lab results on My Chart per Dr. Vanetta Shawl note. Routed to PCP.

## 2023-07-19 DIAGNOSIS — S63649A Sprain of metacarpophalangeal joint of unspecified thumb, initial encounter: Secondary | ICD-10-CM | POA: Insufficient documentation

## 2023-08-11 ENCOUNTER — Encounter: Payer: Self-pay | Admitting: Cardiology

## 2023-08-11 ENCOUNTER — Ambulatory Visit: Attending: Cardiology | Admitting: Cardiology

## 2023-08-11 ENCOUNTER — Telehealth: Payer: Self-pay

## 2023-08-11 VITALS — BP 132/86 | HR 102 | Ht 64.0 in | Wt 150.0 lb

## 2023-08-11 DIAGNOSIS — K219 Gastro-esophageal reflux disease without esophagitis: Secondary | ICD-10-CM

## 2023-08-11 DIAGNOSIS — I3139 Other pericardial effusion (noninflammatory): Secondary | ICD-10-CM

## 2023-08-11 DIAGNOSIS — R799 Abnormal finding of blood chemistry, unspecified: Secondary | ICD-10-CM | POA: Insufficient documentation

## 2023-08-11 DIAGNOSIS — I10 Essential (primary) hypertension: Secondary | ICD-10-CM

## 2023-08-11 DIAGNOSIS — M0600 Rheumatoid arthritis without rheumatoid factor, unspecified site: Secondary | ICD-10-CM

## 2023-08-11 DIAGNOSIS — R0609 Other forms of dyspnea: Secondary | ICD-10-CM

## 2023-08-11 DIAGNOSIS — R0789 Other chest pain: Secondary | ICD-10-CM

## 2023-08-11 DIAGNOSIS — E785 Hyperlipidemia, unspecified: Secondary | ICD-10-CM

## 2023-08-11 DIAGNOSIS — S2341XA Sprain of ribs, initial encounter: Secondary | ICD-10-CM | POA: Insufficient documentation

## 2023-08-11 NOTE — Telephone Encounter (Signed)
 Sent to front desk for appt per Dr. Krasowski

## 2023-08-11 NOTE — Patient Instructions (Addendum)
 Medication Instructions:  Your physician recommends that you continue on your current medications as directed. Please refer to the Current Medication list given to you today.  *If you need a refill on your cardiac medications before your next appointment, please call your pharmacy*   Lab Work: Troponin, Sed Rate CRP- today If you have labs (blood work) drawn today and your tests are completely normal, you will receive your results only by: MyChart Message (if you have MyChart) OR A paper copy in the mail If you have any lab test that is abnormal or we need to change your treatment, we will call you to review the results.   Testing/Procedures: Your physician has requested that you have an echocardiogram. Echocardiography is a painless test that uses sound waves to create images of your heart. It provides your doctor with information about the size and shape of your heart and how well your heart's chambers and valves are working. This procedure takes approximately one hour. There are no restrictions for this procedure. Please do NOT wear cologne, perfume, aftershave, or lotions (deodorant is allowed). Please arrive 15 minutes prior to your appointment time.  Please note: We ask at that you not bring children with you during ultrasound (echo/ vascular) testing. Due to room size and safety concerns, children are not allowed in the ultrasound rooms during exams. Our front office staff cannot provide observation of children in our lobby area while testing is being conducted. An adult accompanying a patient to their appointment will only be allowed in the ultrasound room at the discretion of the ultrasound technician under special circumstances. We apologize for any inconvenience.    Follow-Up: At [facility], you and your health needs are our priority.  As part of our continuing mission to provide you with exceptional heart care, we have created designated Provider Care Teams.  These Care Teams  include your primary Cardiologist (physician) and Advanced Practice Providers (APPs -  Physician Assistants and Nurse Practitioners) who all work together to provide you with the care you need, when you need it.  We recommend signing up for the patient portal called MyChart.  Sign up information is provided on this After Visit Summary.  MyChart is used to connect with patients for Virtual Visits (Telemedicine).  Patients are able to view lab/test results, encounter notes, upcoming appointments, etc.  Non-urgent messages can be sent to your provider as well.   To learn more about what you can do with MyChart, go to [email or web address].    Your next appointment:   Keep scheduled follow up appt  The format for your next appointment:   In Person  Provider:   Lamar Fitch, MD    Other Instructions NA

## 2023-08-11 NOTE — Progress Notes (Signed)
 Cardiology Office Note:    Date:  08/11/2023   ID:  Allean Harlene Pepper, DOB [birth year], MRN [id number]  PCP:  Jefferey Fitch, MD  Cardiologist:  Lamar Fitch, MD    Referring MD: Jefferey Fitch, MD   Chief Complaint  Patient presents with   Shortness of Breath    History of Present Illness:    Kahlan Engebretson is a 53 y.o. female past medical history significant for atypical chest pain, shortness of breath, swelling of lower extremities, cardiac workup so far was negative, coronary CT angio done in September of last year showed calcium score 0, total plaque volume was only 10 mm, minimal plaque in LAD.  She also had echocardiogram done which showed normal left ventricle ejection fraction without pericardial effusion.  She requested to be seen today because she had cold-like symptoms few days ago and since that time she has had continuous pressure in the chest.  Nothing make it worse nothing make it better.  She said when she laid down flat it is somewhat difficult for her because of the sensation.  Not much shortness of breath no swelling of lower extremities.  Additional problem include rheumatoid arthritis Crohn's disease.  Past Medical History:  Diagnosis Date   Edema    Enteropathic arthritis    Gastroesophageal reflux disease 09/12/2020   Hx of chronic ulcerative colitis    Hypertension    Insomnia    Osteoarthritis    Other postherpetic nervous system involvement    Ovarian cyst    Peripheral edema    Rheumatoid arthritis (HCC)    Ulcerative colitis (HCC) 10/05/2016    Past Surgical History:  Procedure Laterality Date   CESAREAN SECTION     x2   CHOLECYSTECTOMY     ex lap for ruptured ovarian cyst Left    hysterectomy with ovaries remaining     LUMBAR FUSION     L5-S1   Neck fusion     C5-6-7   Torn meniscus repair Right     Current Medications: Current Meds  Medication Sig   albuterol  (PROVENTIL ) (2.5 MG/3ML) 0.083% nebulizer solution  Take 2.5 mg by nebulization every 6 (six) hours as needed for wheezing or shortness of breath.   albuterol  (VENTOLIN  HFA) 108 (90 Base) MCG/ACT inhaler Inhale 2 puffs into the lungs every 6 (six) hours as needed for wheezing or shortness of breath.   carvedilol  (COREG ) 12.5 MG tablet Take 1 tablet (12.5 mg total) by mouth 2 (two) times daily.   certolizumab pegol (CIMZIA) 2 X 200 MG KIT Inject 200 mg into the skin every 30 (thirty) days.   folic acid (FOLVITE) 1 MG tablet Take 2 mg by mouth daily.   furosemide  (LASIX ) 40 MG tablet Take 1 tablet (40 mg total) by mouth daily.   gabapentin (NEURONTIN) 300 MG capsule Take 300 mg by mouth at bedtime as needed (sleep). Takes one tablet qhs   HYDROcodone-acetaminophen (NORCO/VICODIN) 5-325 MG tablet Take 1 tablet by mouth 2 (two) times daily as needed for moderate pain or severe pain. for pain   hydrocortisone (ANUSOL-HC) 2.5 % rectal cream Place 1 Application rectally 2 (two) times daily.   hydroxychloroquine (PLAQUENIL) 200 MG tablet Take 200 mg by mouth 2 (two) times daily.   lansoprazole (PREVACID) 30 MG capsule Take 30 mg by mouth 2 (two) times daily before a meal.   methocarbamol (ROBAXIN) 500 MG tablet Take 500 mg by mouth in the morning and at bedtime.   pregabalin (LYRICA) 75 MG  capsule Take 75 mg by mouth daily.   spironolactone (ALDACTONE) 25 MG tablet Take 12.5 mg by mouth 2 (two) times daily.   sulfaSALAzine (AZULFIDINE) 500 MG tablet Take 1,000 mg by mouth 2 (two) times daily.   tirzepatide (ZEPBOUND) 7.5 MG/0.5ML Pen Inject 7.5 mg into the skin once a week.   topiramate (TOPAMAX) 50 MG tablet Take 50 mg by mouth at bedtime.   traMADol (ULTRAM) 50 MG tablet Take 50 mg by mouth 2 (two) times daily as needed for moderate pain.   triamcinolone  cream (KENALOG ) 0.1 % Apply 1 Application topically 2 (two) times daily as needed (rash).     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 [employer]    Occupation: Charity fundraiser  Tobacco Use   Smoking status: Never   Smokeless tobacco: Never  Vaping Use   Vaping status: Never Used  Substance and Sexual Activity   Alcohol use: Never   Drug use: Never   Sexual activity: Yes  Other Topics Concern   Not on file  Social History Narrative   Married 2 sons and one girl   One story   Right handed    Social Drivers of Health   Financial Resource Strain: Low Risk  (06/15/2023)   Received from [facility]   Overall Financial Resource Strain (CARDIA)    Difficulty of Paying Living Expenses: Not hard at all  Food Insecurity: No Food Insecurity (06/15/2023)   [facility]   Hunger Vital Sign    Within the past 12 months, you worried that your food would run out before you got the money to buy more.: Never true    Within the past 12 months, the food you bought just didn't last and you didn't have money to get more.: Never true  Transportation Needs: No Transportation Needs (06/15/2023)   Received from [facility] - Transportation    Lack of Transportation (Medical): No    Lack of Transportation (Non-Medical): No  Physical Activity: Not on file  Stress: Not on file  Social Connections: Not on file     Family History: The patient's family history includes CAD in her father; Heart attack in her father; Hypertension in her father and mother; Non-Hodgkin's lymphoma in her mother. ROS:   Please see the history of present illness.    All 14 point review of systems negative except as described per history of present illness  EKGs/Labs/Other Studies Reviewed:    EKG  Interpretation Date/Time:  Wednesday August 11 2023 12:10:26 EDT Ventricular Rate:  102 PR Interval:  160 QRS Duration:  74 QT Interval:  334 QTC Calculation: 435 R Axis:   83  Text Interpretation: Sinus tachycardia Right atrial enlargement When compared with ECG of 02-Jun-2023 08:15, Premature ventricular complexes are no longer Present Questionable change in QRS axis Confirmed by Bernie Charleston [phone]) on 08/11/2023 12:14:18 PM    Recent Labs: 07/02/2023: BUN 20; Creatinine, Ser 0.96; Magnesium 2.2; Potassium 4.3; Sodium 139; TSH 1.990  Recent Lipid Panel No results found for: CHOL, TRIG, HDL, CHOLHDL, VLDL, LDLCALC, LDLDIRECT  Physical Exam:    VS:  BP 132/86 (BP  Location: Right Leg, Patient Position: Sitting)   Pulse (!) 102   Ht 5' 4 (1.626 m)   Wt 150 lb (68 kg)   SpO2 97%   BMI 25.75 kg/m     Wt Readings from Last 3 Encounters:  08/11/23 150 lb (68 kg)  06/02/23 166 lb 6.4 oz (75.5 kg)  11/16/22 183 lb 6.4 oz (83.2 kg)     GEN:  Well nourished, well developed in no acute distress HEENT: Normal NECK: No JVD; No carotid bruits LYMPHATICS: No lymphadenopathy CARDIAC: RRR, no murmurs, no rubs, no gallops RESPIRATORY:  Clear to auscultation without rales, wheezing or rhonchi  ABDOMEN: Soft, non-tender, non-distended MUSCULOSKELETAL:  No edema; No deformity  SKIN: Warm and dry LOWER EXTREMITIES: no swelling NEUROLOGIC:  Alert and oriented x 3 PSYCHIATRIC:  Normal affect   ASSESSMENT:    1. Atypical chest pain   2. Essential hypertension   3. Pericardial effusion   4. Gastroesophageal reflux disease, unspecified whether esophagitis present   5. Rheumatoid arthritis with negative rheumatoid factor, involving unspecified site (HCC)   6. Dyslipidemia    PLAN:    In order of problems listed above:  Atypical chest pain look very noncardiac EKG did not show any acute ST segment changes, for completion I will ask her to have troponin I done.  With her  rheumatological issue I suspect she may have some pericarditis, echocardiogram will be done to look for pericardial effusion, inflammatory markers will be checked I will check her C-reactive protein as well as sedimentation rate.  She is scheduled to see rheumatologist tomorrow. Essential hypertension blood pressure is decently controlled continue present management. Rheumatoid arthritis follow-up by rheumatology team   Medication Adjustments/Labs and Tests Ordered: Current medicines are reviewed at length with the patient today.  Concerns regarding medicines are outlined above.  Orders Placed This Encounter  Procedures   Troponin T   Sedimentation rate   CRP High sensitivity   EKG 12-Lead   Medication changes: No orders of the defined types were placed in this encounter.   Signed, Lamar DOROTHA Fitch, MD, [facility] 08/11/2023 12:27 PM    [facility]

## 2023-08-12 LAB — TROPONIN T: Troponin T (Highly Sensitive): 8 ng/L (ref 0–14)

## 2023-08-12 LAB — HIGH SENSITIVITY CRP: CRP, High Sensitivity: 0.74 mg/L (ref 0.00–3.00)

## 2023-08-12 LAB — SEDIMENTATION RATE: Sed Rate: 2 mm/h (ref 0–40)

## 2023-08-13 ENCOUNTER — Telehealth: Payer: Self-pay

## 2023-08-13 ENCOUNTER — Ambulatory Visit: Payer: Self-pay | Admitting: Cardiology

## 2023-08-13 NOTE — Telephone Encounter (Signed)
 Left message on My Chart with lab results per Dr. Vanetta Shawl note. Routed to PCP.

## 2023-08-23 ENCOUNTER — Other Ambulatory Visit: Payer: Self-pay | Admitting: Internal Medicine

## 2023-08-23 DIAGNOSIS — M7989 Other specified soft tissue disorders: Secondary | ICD-10-CM

## 2023-08-23 DIAGNOSIS — M79622 Pain in left upper arm: Secondary | ICD-10-CM

## 2023-08-31 ENCOUNTER — Ambulatory Visit
Admission: RE | Admit: 2023-08-31 | Discharge: 2023-08-31 | Disposition: A | Discharge status: Home / Self Care | Source: Ambulatory Visit | Attending: Internal Medicine | Admitting: Internal Medicine

## 2023-08-31 DIAGNOSIS — M7989 Other specified soft tissue disorders: Secondary | ICD-10-CM

## 2023-08-31 DIAGNOSIS — M79622 Pain in left upper arm: Secondary | ICD-10-CM

## 2023-09-01 ENCOUNTER — Ambulatory Visit

## 2023-09-06 ENCOUNTER — Ambulatory Visit

## 2023-09-07 DIAGNOSIS — D84821 Immunodeficiency due to drugs: Secondary | ICD-10-CM | POA: Insufficient documentation

## 2023-09-08 ENCOUNTER — Ambulatory Visit

## 2023-10-01 ENCOUNTER — Ambulatory Visit: Attending: Cardiology

## 2023-10-01 DIAGNOSIS — R0609 Other forms of dyspnea: Secondary | ICD-10-CM

## 2023-10-01 LAB — ECHOCARDIOGRAM COMPLETE
Area-P 1/2: 2.94 cm2
S' Lateral: 3.3 cm

## 2023-10-11 ENCOUNTER — Telehealth: Payer: Self-pay

## 2023-10-11 NOTE — Telephone Encounter (Signed)
 Pt viewed Echo results on My Chart per Dr. Vanetta Shawl note. Routed to PCP.

## 2023-12-07 ENCOUNTER — Encounter: Payer: Self-pay | Admitting: *Deleted

## 2023-12-08 ENCOUNTER — Encounter: Payer: Self-pay | Admitting: Cardiology

## 2023-12-08 ENCOUNTER — Ambulatory Visit: Attending: Cardiology | Admitting: Cardiology

## 2023-12-08 VITALS — BP 130/80 | HR 82 | Ht 64.0 in | Wt 147.0 lb

## 2023-12-08 DIAGNOSIS — I3139 Other pericardial effusion (noninflammatory): Secondary | ICD-10-CM | POA: Diagnosis not present

## 2023-12-08 DIAGNOSIS — E785 Hyperlipidemia, unspecified: Secondary | ICD-10-CM

## 2023-12-08 DIAGNOSIS — I10 Essential (primary) hypertension: Secondary | ICD-10-CM | POA: Diagnosis not present

## 2023-12-08 MED ORDER — CARVEDILOL 12.5 MG PO TABS: 12.5000 mg | ORAL_TABLET | Freq: Two times a day (BID) | ORAL | 3 refills | Status: AC

## 2023-12-08 MED ORDER — FUROSEMIDE 40 MG PO TABS: 40.0000 mg | ORAL_TABLET | Freq: Every day | ORAL | 3 refills | Status: AC

## 2023-12-08 NOTE — Addendum Note (Signed)
 Addended by: ARLOA DONOVAN SAUNDERS on: 12/08/2023 08:52 AM   Modules accepted: Orders

## 2023-12-08 NOTE — Progress Notes (Signed)
 Cardiology Office Note:    Date:  12/08/2023   ID:  Michelle Newman, DOB June 27, 1970, MRN [id number]  PCP:  Jefferey Fitch, MD  Cardiologist:  Lamar Fitch, MD    Referring MD: Jefferey Fitch, MD   Chief Complaint  Patient presents with   Follow-up    History of Present Illness:    Michelle Newman is a 53 y.o. female past medical history significant for atypical chest pain shortness of breath swelling of lower extremities with profound weakness she did have coronary CT angio done in September of last year which showed calcium score 0 total plaque volume only 10 mm cubic, minimal plaque in LAD echocardiogram showed preserved left ventricle ejection fraction without pericardial fusion recently she showed up in my office complaining of not feeling well chest pain.  Another echocardiogram was done which showed no pericardial effusion normal function she does have quite extensive evaluation being done by [facility] they suspecting some neuromuscular disease she complained of profound weakness fatigue at the end of the day to the point that her legs are shaking and she had difficulty urinating sometimes difficulty swallowing.  Clearly some neurological issue happening there still no clear-cut diagnosis.  However since I seen her last time cardiac wise doing well  Past Medical History:  Diagnosis Date   Edema    Enteropathic arthritis    Gastroesophageal reflux disease 09/12/2020   Hx of chronic ulcerative colitis    Hypertension    Insomnia    Osteoarthritis    Other postherpetic nervous system involvement    Ovarian cyst    Peripheral edema    Rheumatoid arthritis (HCC)    Ulcerative colitis (HCC) 10/05/2016    Past Surgical History:  Procedure Laterality Date   CESAREAN SECTION     x2   CHOLECYSTECTOMY     ex lap for ruptured ovarian cyst Left    hysterectomy with ovaries remaining     LUMBAR FUSION     L5-S1   Neck fusion     C5-6-7   Torn  meniscus repair Right     Current Medications: Current Meds  Medication Sig   albuterol  (PROVENTIL ) (2.5 MG/3ML) 0.083% nebulizer solution Take 2.5 mg by nebulization every 6 (six) hours as needed for wheezing or shortness of breath.   albuterol  (VENTOLIN  HFA) 108 (90 Base) MCG/ACT inhaler Inhale 2 puffs into the lungs every 6 (six) hours as needed for wheezing or shortness of breath.   carvedilol  (COREG ) 12.5 MG tablet Take 1 tablet (12.5 mg total) by mouth 2 (two) times daily.   certolizumab pegol (CIMZIA) 2 X 200 MG KIT Inject 200 mg into the skin every 30 (thirty) days.   Docusate Sodium (DSS) 100 MG CAPS Take 100 mg by mouth daily.   folic acid (FOLVITE) 1 MG tablet Take 2 mg by mouth daily.   furosemide  (LASIX ) 40 MG tablet Take 1 tablet (40 mg total) by mouth daily.   gabapentin (NEURONTIN) 300 MG capsule Take 300 mg by mouth at bedtime as needed (sleep). Takes one tablet qhs   hydrocortisone (ANUSOL-HC) 2.5 % rectal cream Place 1 Application rectally 2 (two) times daily.   hydroxychloroquine (PLAQUENIL) 200 MG tablet Take 200 mg by mouth 2 (two) times daily.   methocarbamol (ROBAXIN) 500 MG tablet Take 500 mg by mouth in the morning and at bedtime.   pantoprazole (PROTONIX) 40 MG tablet Take 40 mg by mouth 2 (two) times daily.   pregabalin (LYRICA) 75 MG capsule Take  75 mg by mouth daily.   sulfaSALAzine (AZULFIDINE) 500 MG tablet Take 1,000 mg by mouth 2 (two) times daily.   tirzepatide (ZEPBOUND) 7.5 MG/0.5ML Pen Inject 7.5 mg into the skin once a week.   topiramate (TOPAMAX) 50 MG tablet Take 50 mg by mouth at bedtime.   traMADol (ULTRAM) 50 MG tablet Take 50 mg by mouth 2 (two) times daily as needed for moderate pain.   triamcinolone  cream (KENALOG ) 0.1 % Apply 1 Application topically 2 (two) times daily as needed (rash).     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 [employer]    Occupation: [employer]  Tobacco Use   Smoking status: Never   Smokeless tobacco: Never  Vaping Use   Vaping status: Never Used  Substance and Sexual Activity   Alcohol use: Never   Drug use: Never   Sexual activity: Yes  Other Topics Concern   Not on file  Social History Narrative   Married 2 sons and one girl   One story   Right handed    Social Drivers of Health   Financial Resource Strain: Low Risk  (09/06/2023)   Received from [facility]   Overall Financial Resource Strain (CARDIA)    How hard is it for you to pay for the very basics like food, housing, medical care, and heating?: Not very hard  Food Insecurity: No Food Insecurity (09/06/2023)   Received from [facility]   Hunger Vital Sign    Within the past 12 months, you worried that your food would run out before you got the money to buy more.: Never true    Within the past 12 months, the food you bought just didn't last and you didn't have money to get more.: Never true  Transportation Needs: No Transportation Needs (09/06/2023)   Received from [facility] - Transportation    In the past 12 months, has lack of transportation kept you from medical appointments or from getting medications?: No    In the past 12 months, has lack of transportation kept you from meetings, work, or from getting things needed for daily living?: No  Physical Activity: Inactive (09/06/2023)   Received from [facility]   Exercise Vital Sign    On average, how many days per week do you engage in moderate to strenuous exercise (like a brisk walk)?: 0 days    Minutes of Exercise per Session: Not on file  Stress: No Stress Concern Present  (09/06/2023)   Received from [facility] - Occupational Stress Questionnaire    Do you feel stress - tense, restless, nervous, or anxious, or unable to sleep at night because your mind is troubled all the time - these days?: Only a little  Social Connections: Moderately Integrated (09/06/2023)   Received from [facility]   Social Network    How would you rate your social network (family, work, friends)?: Adequate participation with social networks     Family History: The patient's family history includes CAD in her father; Heart attack in her father; Hypertension in her father and mother; Non-Hodgkin's lymphoma in  her mother. ROS:   Please see the history of present illness.    All 14 point review of systems negative except as described per history of present illness  EKGs/Labs/Other Studies Reviewed:         Recent Labs: 07/02/2023: BUN 20; Creatinine, Ser 0.96; Magnesium 2.2; Potassium 4.3; Sodium 139; TSH 1.990  Recent Lipid Panel No results found for: CHOL, TRIG, HDL, CHOLHDL, VLDL, LDLCALC, LDLDIRECT  Physical Exam:    VS:  BP 130/80   Pulse 82   Ht 5' 4 (1.626 m)   Wt 147 lb (66.7 kg)   SpO2 98%   BMI 25.23 kg/m     Wt Readings from Last 3 Encounters:  12/08/23 147 lb (66.7 kg)  08/11/23 150 lb (68 kg)  06/02/23 166 lb 6.4 oz (75.5 kg)     GEN:  Well nourished, well developed in no acute distress HEENT: Normal NECK: No JVD; No carotid bruits LYMPHATICS: No lymphadenopathy CARDIAC: RRR, no murmurs, no rubs, no gallops RESPIRATORY:  Clear to auscultation without rales, wheezing or rhonchi  ABDOMEN: Soft, non-tender, non-distended MUSCULOSKELETAL:  No edema; No deformity  SKIN: Warm and dry LOWER EXTREMITIES: no swelling NEUROLOGIC:  Alert and oriented x 3 PSYCHIATRIC:  Normal affect   ASSESSMENT:    1. Essential hypertension   2. Pericardial effusion   3. Dyslipidemia    PLAN:    In order of  problems listed above:  Chest pain atypical denies having any doing well from that point review coronary CT angio showed only minimal disease.  Not on antiplatelets therapy, not on statin since she got still an answer neuromuscular disease and I do not want initiate any therapy until that will be clarified. Pericardial effusion none around 2 last echocardiograms. Essential hypertension blood pressure well-controlled continue present management. Dyslipidemia discussion as above   Medication Adjustments/Labs and Tests Ordered: Current medicines are reviewed at length with the patient today.  Concerns regarding medicines are outlined above.  No orders of the defined types were placed in this encounter.  Medication changes: No orders of the defined types were placed in this encounter.   Signed, Lamar DOROTHA Fitch, MD, [facility] 12/08/2023 8:44 AM    [facility]

## 2023-12-08 NOTE — Patient Instructions (Signed)
 Medication Instructions:  Your physician recommends that you continue on your current medications as directed. Please refer to the Current Medication list given to you today.  *If you need a refill on your cardiac medications before your next appointment, please call your pharmacy*  Lab Work: NONE If you have labs (blood work) drawn today and your tests are completely normal, you will receive your results only by: MyChart Message (if you have MyChart) OR A paper copy in the mail If you have any lab test that is abnormal or we need to change your treatment, we will call you to review the results.  Testing/Procedures: NONE  Follow-Up: At [facility], you and your health needs are our priority.  As part of our continuing mission to provide you with exceptional heart care, our providers are all part of one team.  This team includes your primary Cardiologist (physician) and Advanced Practice Providers or APPs (Physician Assistants and Nurse Practitioners) who all work together to provide you with the care you need, when you need it.  Your next appointment:   1 year(s)  Provider:   Ralene Burger, MD    We recommend signing up for the patient portal called "MyChart".  Sign up information is provided on this After Visit Summary.  MyChart is used to connect with patients for Virtual Visits (Telemedicine).  Patients are able to view lab/test results, encounter notes, upcoming appointments, etc.  Non-urgent messages can be sent to your provider as well.   To learn more about what you can do with MyChart, go to [email or web address].   Other Instructions

## 2024-02-14 ENCOUNTER — Other Ambulatory Visit: Payer: Self-pay | Admitting: Family Medicine

## 2024-02-14 ENCOUNTER — Encounter: Payer: Self-pay | Admitting: Cardiology

## 2024-02-14 DIAGNOSIS — N390 Urinary tract infection, site not specified: Secondary | ICD-10-CM

## 2024-02-16 ENCOUNTER — Ambulatory Visit

## 2024-03-15 ENCOUNTER — Ambulatory Visit: Admitting: Internal Medicine
